# Patient Record
Sex: Male | Born: 1955 | Race: Black or African American | Hispanic: No | Marital: Married | Smoking: Former smoker
Health system: Southern US, Community
[De-identification: ages and names within clinical notes are randomized; demographics above are authoritative.]

## PROBLEM LIST (undated history)

## (undated) DIAGNOSIS — J302 Other seasonal allergic rhinitis: Secondary | ICD-10-CM

## (undated) DIAGNOSIS — M1990 Unspecified osteoarthritis, unspecified site: Secondary | ICD-10-CM

## (undated) DIAGNOSIS — Z973 Presence of spectacles and contact lenses: Secondary | ICD-10-CM

## (undated) DIAGNOSIS — G629 Polyneuropathy, unspecified: Secondary | ICD-10-CM

## (undated) DIAGNOSIS — E119 Type 2 diabetes mellitus without complications: Secondary | ICD-10-CM

---

## 1998-08-23 HISTORY — PX: FINGER AMPUTATION: SHX636

## 2013-09-26 ENCOUNTER — Encounter: Payer: Self-pay | Admitting: Diagnostic Neuroimaging

## 2013-09-26 ENCOUNTER — Encounter (INDEPENDENT_AMBULATORY_CARE_PROVIDER_SITE_OTHER): Payer: Self-pay

## 2013-09-26 ENCOUNTER — Ambulatory Visit (INDEPENDENT_AMBULATORY_CARE_PROVIDER_SITE_OTHER): Payer: BC Managed Care – PPO | Admitting: Diagnostic Neuroimaging

## 2013-09-26 VITALS — BP 141/81 | HR 95 | Ht 66.5 in | Wt 171.0 lb

## 2013-09-26 DIAGNOSIS — M79609 Pain in unspecified limb: Secondary | ICD-10-CM

## 2013-09-26 DIAGNOSIS — M545 Low back pain, unspecified: Secondary | ICD-10-CM

## 2013-09-26 DIAGNOSIS — E1149 Type 2 diabetes mellitus with other diabetic neurological complication: Secondary | ICD-10-CM

## 2013-09-26 DIAGNOSIS — R209 Unspecified disturbances of skin sensation: Secondary | ICD-10-CM

## 2013-09-26 DIAGNOSIS — E1142 Type 2 diabetes mellitus with diabetic polyneuropathy: Secondary | ICD-10-CM

## 2013-09-26 DIAGNOSIS — R200 Anesthesia of skin: Secondary | ICD-10-CM

## 2013-09-26 DIAGNOSIS — E1140 Type 2 diabetes mellitus with diabetic neuropathy, unspecified: Secondary | ICD-10-CM

## 2013-09-26 DIAGNOSIS — M542 Cervicalgia: Secondary | ICD-10-CM

## 2013-09-26 MED ORDER — GABAPENTIN 300 MG PO CAPS: 600.0000 mg | ORAL_CAPSULE | Freq: Three times a day (TID) | ORAL | Status: DC

## 2013-09-26 NOTE — Patient Instructions (Signed)
I will check labs and lumbar puncture.  Increase gabapentin to 600mg  (3 times per day).

## 2013-09-26 NOTE — Progress Notes (Signed)
[facility]  PATIENT: Austin Savage DOB: 08-16-1956  REFERRING CLINICIAN: Nitka HISTORY FROM: patient  REASON FOR VISIT: new consult   HISTORICAL  CHIEF COMPLAINT:  Chief Complaint  Patient presents with  . Neurologic Problem    NP..Diabetes, Polyneuropathic pain #7    HISTORY OF PRESENT ILLNESS:   58 year old ambitious male with diabetes, hypercholesterolemia, here for evaluation of neck pain, low back pain, diabetic neuropathy.   2011 patient developed neck pain radiating to his right shoulder, right elbow. He had some left arm pain. 2013 patient had low back pain radiating to both legs.  Symptoms have progressively worsened. He also has numbness and tingling in his toes and feet with some burning sensation. He has developed some weakness in his left arm and left leg.  Patient is seen by orthopedic surgery had MRI of the cervical and lumbar spine with mild degenerative changes. 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    REVIEW OF SYSTEMS: Full 14 system review of systems performed and notable only for numbness, weakness, pain.  ALLERGIES: No Known Allergies  HOME MEDICATIONS: No outpatient prescriptions prior to visit.   No facility-administered medications prior to visit.    PAST MEDICAL HISTORY: No past medical history on file.  PAST SURGICAL HISTORY: No past surgical history on file.  FAMILY HISTORY: No family history on file.  SOCIAL HISTORY:  History   Social History  . Marital Status: Unknown    Spouse Name: katheren    Number of Children: 3  . Years of Education: college   Occupational History  . Not on file.   Social History Main Topics  . Smoking status: Former Games developer  . Smokeless tobacco: Not on file  . Alcohol Use: No  . Drug Use: No  . Sexual Activity: Not on file   Other Topics Concern  . Not on file   Social History Narrative  .  No narrative on file     PHYSICAL EXAM  Filed Vitals:   09/26/13 1037  BP: 141/81  Pulse: 95  Height: 5' 6.5" (1.689 m)  Weight: 171 lb (77.565 kg)    Not recorded    Body mass index is 27.19 kg/(m^2).  GENERAL EXAM: Patient is in no distress; well developed, nourished and groomed; neck is supple; PARTIALLY EDENTULOUS.  CARDIOVASCULAR: Regular rate and rhythm, no murmurs, no carotid bruits  NEUROLOGIC: MENTAL STATUS: awake, alert, oriented to person, place and time, recent and remote memory intact, normal attention and concentration, language fluent, comprehension intact, naming intact, fund of knowledge appropriate CRANIAL NERVE: no papilledema on fundoscopic exam, pupils equal and reactive to light, visual fields full to confrontation, extraocular muscles intact, no nystagmus, facial sensation and strength symmetric, hearing intact, palate elevates symmetrically, uvula midline, shoulder shrug symmetric, tongue midline. MOTOR: normal bulk and tone, full strength in the RUE AND RLE; LUE (TRICEPS 4), LLE (HF 3) SENSORY: ABSENT VIB AT TOES; DECR PP, TEMP IN FEET.  COORDINATION: finger-nose-finger normal REFLEXES: BUE 1, KNEES TRACE, ANKLES 0. DOWN GOING TOES. GAIT/STATION: SLOW, ANTALGIC GAIT. DIFF WITH TOE AND HEEL. CANNOT TANDEM. Romberg is negative    DIAGNOSTIC DATA (LABS, IMAGING, TESTING) - I reviewed patient records, labs, notes, testing and imaging myself where available.  No results found for this basename: WBC, HGB, HCT, MCV, PLT   No results found for this basename: na, k, cl, co2, glucose, bun, creatinine, calcium, prot, albumin, ast, alt, alkphos, bilitot, gfrnonaa, gfraa   No  results found for this basename: CHOL, HDL, LDLCALC, LDLDIRECT, TRIG, CHOLHDL   No results found for this basename: HGBA1C   No results found for this basename: VITAMINB12   No results found for this basename: TSH    08/09/13 MRI cervical spine - mild degenerative chages with mild  biforaminal foraminal stenosis C4 to C7, and mild-mod spinal stenosis C5-6 and C6-7.   08/09/13 MRI lumbar spine - mild biforaminal foraminal stenosis at L4-5 and L5-S1.   ASSESSMENT AND PLAN  58 y.o. year old male here with progressive neck pain, low back pain, numbness and tingling in the feet, left arm and left leg weakness. Mild degenerative changes of cervical and lumbar spine noted on MRI. EMG demonstrates axonal and demyelinating neuropathy. Will proceed with demyelinating neuropathy workup. Diabetes could also account for patient's symptoms.  Ddx: demyelinating neuropathy (CIDP), diabetic neuropathy, metabolic, autoimmune, inflamm, musculoskeletal pain, arthritis/degenerative  PLAN: - increase gabapentin to 600mg  TID - labs and lumbar puncture testing (demyelinating neuropathy workup) - consider IVIG treatment after workup   Orders Placed This Encounter  Procedures  . DG FLUORO GUIDE LUMBAR PUNCTURE  . Neuropathy Panel  . Hemoglobin A1c  . Vitamin B12  . TSH  . CBC With differential/Platelet  . Comprehensive metabolic panel  . ANA w/Reflex  . HIV antibody  . RPR  . Pan-ANCA    Return in about 3 months (around 12/24/2013).    Suanne MarkerVIKRAM R. Ronae Noell, MD 09/26/2013, 12:02 PM Certified in Neurology, Neurophysiology and Neuroimaging  [facility] [address], [address] [place], [place] [address] [phone]

## 2013-09-28 ENCOUNTER — Telehealth: Payer: Self-pay | Admitting: Diagnostic Neuroimaging

## 2013-09-28 ENCOUNTER — Other Ambulatory Visit: Payer: Self-pay | Admitting: Diagnostic Neuroimaging

## 2013-09-28 DIAGNOSIS — R200 Anesthesia of skin: Secondary | ICD-10-CM

## 2013-09-28 NOTE — Telephone Encounter (Signed)
Danielle from [facility] calling to state that we referred him to them to get an LP but he needs to have an order for MR scan of the brain before they can do the LP. Please call Danielle and send order.

## 2013-09-28 NOTE — Telephone Encounter (Signed)
Ordered. -VRP 

## 2013-09-28 NOTE — Telephone Encounter (Signed)
I called Austin Savage, [facility] and although has had MRI C and L spines.  Needs to have MRI brain.  They had spoken to pt and he has not had one.

## 2013-10-23 ENCOUNTER — Encounter (HOSPITAL_BASED_OUTPATIENT_CLINIC_OR_DEPARTMENT_OTHER): Payer: Self-pay | Admitting: *Deleted

## 2013-10-23 ENCOUNTER — Other Ambulatory Visit (HOSPITAL_BASED_OUTPATIENT_CLINIC_OR_DEPARTMENT_OTHER): Payer: Self-pay | Admitting: Specialist

## 2013-10-23 NOTE — Progress Notes (Signed)
Pt has had very little health issues except diabetic-no cardiac-will need ekg-had bmet-cbc 09/26/13-may need istat if anesthesia wants

## 2013-10-29 ENCOUNTER — Encounter (HOSPITAL_BASED_OUTPATIENT_CLINIC_OR_DEPARTMENT_OTHER): Payer: Self-pay | Admitting: Anesthesiology

## 2013-10-29 ENCOUNTER — Encounter (HOSPITAL_BASED_OUTPATIENT_CLINIC_OR_DEPARTMENT_OTHER)
Admission: RE | Disposition: A | Discharge status: Home / Self Care | Payer: Self-pay | Source: Ambulatory Visit | Attending: Specialist

## 2013-10-29 ENCOUNTER — Ambulatory Visit (HOSPITAL_BASED_OUTPATIENT_CLINIC_OR_DEPARTMENT_OTHER)
Admission: RE | Admit: 2013-10-29 | Discharge: 2013-10-29 | Disposition: A | Discharge status: Home / Self Care | Payer: BC Managed Care – PPO | Source: Ambulatory Visit | Attending: Specialist | Admitting: Specialist

## 2013-10-29 ENCOUNTER — Ambulatory Visit (HOSPITAL_BASED_OUTPATIENT_CLINIC_OR_DEPARTMENT_OTHER): Payer: BC Managed Care – PPO | Admitting: Anesthesiology

## 2013-10-29 ENCOUNTER — Encounter (HOSPITAL_BASED_OUTPATIENT_CLINIC_OR_DEPARTMENT_OTHER): Payer: BC Managed Care – PPO | Admitting: Anesthesiology

## 2013-10-29 DIAGNOSIS — G589 Mononeuropathy, unspecified: Secondary | ICD-10-CM | POA: Insufficient documentation

## 2013-10-29 DIAGNOSIS — S68118A Complete traumatic metacarpophalangeal amputation of other finger, initial encounter: Secondary | ICD-10-CM | POA: Insufficient documentation

## 2013-10-29 DIAGNOSIS — G5600 Carpal tunnel syndrome, unspecified upper limb: Secondary | ICD-10-CM | POA: Insufficient documentation

## 2013-10-29 DIAGNOSIS — Z87891 Personal history of nicotine dependence: Secondary | ICD-10-CM | POA: Insufficient documentation

## 2013-10-29 DIAGNOSIS — G5601 Carpal tunnel syndrome, right upper limb: Secondary | ICD-10-CM | POA: Diagnosis present

## 2013-10-29 DIAGNOSIS — E119 Type 2 diabetes mellitus without complications: Secondary | ICD-10-CM | POA: Insufficient documentation

## 2013-10-29 DIAGNOSIS — M129 Arthropathy, unspecified: Secondary | ICD-10-CM | POA: Insufficient documentation

## 2013-10-29 DIAGNOSIS — M654 Radial styloid tenosynovitis [de Quervain]: Secondary | ICD-10-CM | POA: Insufficient documentation

## 2013-10-29 HISTORY — DX: Type 2 diabetes mellitus without complications: E11.9

## 2013-10-29 HISTORY — DX: Presence of spectacles and contact lenses: Z97.3

## 2013-10-29 HISTORY — PX: DORSAL COMPARTMENT RELEASE: SHX5039

## 2013-10-29 HISTORY — PX: CARPAL TUNNEL RELEASE: SHX101

## 2013-10-29 HISTORY — DX: Other seasonal allergic rhinitis: J30.2

## 2013-10-29 HISTORY — DX: Unspecified osteoarthritis, unspecified site: M19.90

## 2013-10-29 HISTORY — DX: Polyneuropathy, unspecified: G62.9

## 2013-10-29 LAB — POCT I-STAT, CHEM 8
BUN: 22 mg/dL (ref 6–23)
CHLORIDE: 104 meq/L (ref 96–112)
Calcium, Ion: 1.32 mmol/L — ABNORMAL HIGH (ref 1.12–1.23)
Creatinine, Ser: 1.3 mg/dL (ref 0.50–1.35)
Glucose, Bld: 331 mg/dL — ABNORMAL HIGH (ref 70–99)
HCT: 40 % (ref 39.0–52.0)
Hemoglobin: 13.6 g/dL (ref 13.0–17.0)
POTASSIUM: 5.1 meq/L (ref 3.7–5.3)
Sodium: 139 mEq/L (ref 137–147)
TCO2: 25 mmol/L (ref 0–100)

## 2013-10-29 LAB — GLUCOSE, CAPILLARY
GLUCOSE-CAPILLARY: 264 mg/dL — AB (ref 70–99)
GLUCOSE-CAPILLARY: 170 mg/dL — AB (ref 70–99)

## 2013-10-29 SURGERY — CARPAL TUNNEL RELEASE
Anesthesia: General | Site: Wrist | Laterality: Right

## 2013-10-29 MED ORDER — OXYCODONE HCL 5 MG/5ML PO SOLN: 5.0000 mg | Freq: Once | ORAL | Status: DC | PRN

## 2013-10-29 MED ORDER — CHLORHEXIDINE GLUCONATE 4 % EX LIQD: 60.0000 mL | Freq: Once | CUTANEOUS | Status: DC

## 2013-10-29 MED ORDER — BUPIVACAINE HCL (PF) 0.5 % IJ SOLN
INTRAMUSCULAR | Status: AC
  Filled 2013-10-29: qty 30

## 2013-10-29 MED ORDER — MIDAZOLAM HCL 2 MG/2ML IJ SOLN: 1.0000 mg | INTRAMUSCULAR | Status: DC | PRN

## 2013-10-29 MED ORDER — CEFAZOLIN SODIUM-DEXTROSE 2-3 GM-% IV SOLR
2.0000 g | INTRAVENOUS | Status: AC
  Administered 2013-10-29: 2 g via INTRAVENOUS

## 2013-10-29 MED ORDER — MIDAZOLAM HCL 2 MG/2ML IJ SOLN
INTRAMUSCULAR | Status: AC
  Filled 2013-10-29: qty 2

## 2013-10-29 MED ORDER — CEFAZOLIN SODIUM-DEXTROSE 2-3 GM-% IV SOLR
INTRAVENOUS | Status: AC
  Filled 2013-10-29: qty 50

## 2013-10-29 MED ORDER — HYDROCODONE-ACETAMINOPHEN 5-325 MG PO TABS: 1.0000 | ORAL_TABLET | ORAL | Status: DC | PRN

## 2013-10-29 MED ORDER — DEXAMETHASONE SODIUM PHOSPHATE 4 MG/ML IJ SOLN
INTRAMUSCULAR | Status: DC | PRN
  Administered 2013-10-29: 10 mg via INTRAVENOUS

## 2013-10-29 MED ORDER — LACTATED RINGERS IV SOLN
INTRAVENOUS | Status: DC
  Administered 2013-10-29: 10:00:00 via INTRAVENOUS

## 2013-10-29 MED ORDER — FENTANYL CITRATE 0.05 MG/ML IJ SOLN: 50.0000 ug | INTRAMUSCULAR | Status: DC | PRN

## 2013-10-29 MED ORDER — INSULIN ASPART 100 UNIT/ML ~~LOC~~ SOLN
10.0000 [IU] | Freq: Once | SUBCUTANEOUS | Status: AC
  Administered 2013-10-29: 10 [IU] via SUBCUTANEOUS

## 2013-10-29 MED ORDER — FENTANYL CITRATE 0.05 MG/ML IJ SOLN
INTRAMUSCULAR | Status: DC | PRN
  Administered 2013-10-29: 100 ug via INTRAVENOUS

## 2013-10-29 MED ORDER — PROMETHAZINE HCL 25 MG/ML IJ SOLN: 6.2500 mg | INTRAMUSCULAR | Status: DC | PRN

## 2013-10-29 MED ORDER — ONDANSETRON HCL 4 MG/2ML IJ SOLN
INTRAMUSCULAR | Status: DC | PRN
  Administered 2013-10-29: 4 mg via INTRAVENOUS

## 2013-10-29 MED ORDER — BUPIVACAINE HCL (PF) 0.5 % IJ SOLN
INTRAMUSCULAR | Status: DC | PRN
  Administered 2013-10-29: 10 mL

## 2013-10-29 MED ORDER — PROPOFOL 10 MG/ML IV BOLUS
INTRAVENOUS | Status: DC | PRN
  Administered 2013-10-29: 200 mg via INTRAVENOUS

## 2013-10-29 MED ORDER — FENTANYL CITRATE 0.05 MG/ML IJ SOLN
INTRAMUSCULAR | Status: AC
  Filled 2013-10-29: qty 2

## 2013-10-29 MED ORDER — OXYCODONE HCL 5 MG PO TABS: 5.0000 mg | ORAL_TABLET | Freq: Once | ORAL | Status: DC | PRN

## 2013-10-29 MED ORDER — MIDAZOLAM HCL 5 MG/5ML IJ SOLN
INTRAMUSCULAR | Status: DC | PRN
  Administered 2013-10-29: 2 mg via INTRAVENOUS

## 2013-10-29 MED ORDER — LIDOCAINE HCL (CARDIAC) 20 MG/ML IV SOLN
INTRAVENOUS | Status: DC | PRN
  Administered 2013-10-29: 60 mg via INTRAVENOUS

## 2013-10-29 MED ORDER — KETOROLAC TROMETHAMINE 30 MG/ML IJ SOLN
INTRAMUSCULAR | Status: DC | PRN
Start: 2013-10-29 — End: 2013-10-29
  Administered 2013-10-29: 30 mg via INTRAVENOUS

## 2013-10-29 MED ORDER — FENTANYL CITRATE 0.05 MG/ML IJ SOLN
INTRAMUSCULAR | Status: AC
  Filled 2013-10-29: qty 4

## 2013-10-29 SURGICAL SUPPLY — 48 items
BANDAGE ELASTIC 3 VELCRO ST LF (GAUZE/BANDAGES/DRESSINGS) ×3 IMPLANT
BENZOIN TINCTURE PRP APPL 2/3 (GAUZE/BANDAGES/DRESSINGS) IMPLANT
BLADE SURG 15 STRL LF DISP TIS (BLADE) ×1 IMPLANT
BLADE SURG 15 STRL SS (BLADE) ×2
BNDG ESMARK 4X9 LF (GAUZE/BANDAGES/DRESSINGS) ×3 IMPLANT
CLOSURE WOUND 1/2 X4 (GAUZE/BANDAGES/DRESSINGS)
CORDS BIPOLAR (ELECTRODE) ×3 IMPLANT
COVER MAYO STAND STRL (DRAPES) ×6 IMPLANT
COVER TABLE BACK 60X90 (DRAPES) ×3 IMPLANT
CUFF TOURNIQUET SINGLE 18IN (TOURNIQUET CUFF) ×3 IMPLANT
DERMABOND ADVANCED (GAUZE/BANDAGES/DRESSINGS)
DERMABOND ADVANCED .7 DNX12 (GAUZE/BANDAGES/DRESSINGS) IMPLANT
DRAPE EXTREMITY T 121X128X90 (DRAPE) ×3 IMPLANT
DRAPE SURG 17X23 STRL (DRAPES) ×3 IMPLANT
DRSG EMULSION OIL 3X3 NADH (GAUZE/BANDAGES/DRESSINGS) ×3 IMPLANT
DURAPREP 26ML APPLICATOR (WOUND CARE) ×3 IMPLANT
ELECT REM PT RETURN 9FT ADLT (ELECTROSURGICAL) ×3
ELECTRODE REM PT RTRN 9FT ADLT (ELECTROSURGICAL) ×1 IMPLANT
GLOVE BIO SURGEON STRL SZ7.5 (GLOVE) IMPLANT
GLOVE BIOGEL M STRL SZ7.5 (GLOVE) ×3 IMPLANT
GLOVE BIOGEL PI IND STRL 8 (GLOVE) ×2 IMPLANT
GLOVE BIOGEL PI INDICATOR 8 (GLOVE) ×4
GLOVE ECLIPSE 7.0 STRL STRAW (GLOVE) ×3 IMPLANT
GLOVE ECLIPSE 8.5 STRL (GLOVE) ×3 IMPLANT
GLOVE INDICATOR STER SZ 9 (GLOVE) ×3 IMPLANT
GOWN STRL REUS W/ TWL LRG LVL3 (GOWN DISPOSABLE) ×2 IMPLANT
GOWN STRL REUS W/ TWL XL LVL3 (GOWN DISPOSABLE) ×2 IMPLANT
GOWN STRL REUS W/TWL LRG LVL3 (GOWN DISPOSABLE) ×4
GOWN STRL REUS W/TWL XL LVL3 (GOWN DISPOSABLE) ×4
NEEDLE HYPO 22GX1.5 SAFETY (NEEDLE) ×3 IMPLANT
PACK BASIN DAY SURGERY FS (CUSTOM PROCEDURE TRAY) ×3 IMPLANT
PAD CAST 3X4 CTTN HI CHSV (CAST SUPPLIES) ×1 IMPLANT
PADDING CAST ABS 4INX4YD NS (CAST SUPPLIES) ×2
PADDING CAST ABS COTTON 4X4 ST (CAST SUPPLIES) ×1 IMPLANT
PADDING CAST COTTON 3X4 STRL (CAST SUPPLIES) ×2
PENCIL BUTTON HOLSTER BLD 10FT (ELECTRODE) IMPLANT
RUBBERBAND STERILE (MISCELLANEOUS) IMPLANT
SPLINT PLASTER CAST XFAST 3X15 (CAST SUPPLIES) ×5 IMPLANT
SPLINT PLASTER XTRA FASTSET 3X (CAST SUPPLIES) ×10
SPONGE GAUZE 4X4 12PLY (GAUZE/BANDAGES/DRESSINGS) ×3 IMPLANT
SPONGE GAUZE 4X4 12PLY STER LF (GAUZE/BANDAGES/DRESSINGS) IMPLANT
STOCKINETTE 4X48 STRL (DRAPES) ×3 IMPLANT
STRIP CLOSURE SKIN 1/2X4 (GAUZE/BANDAGES/DRESSINGS) IMPLANT
SUT ETHILON 4 0 PS 2 18 (SUTURE) ×3 IMPLANT
SUT VIC AB 3-0 FS2 27 (SUTURE) IMPLANT
SYR BULB 3OZ (MISCELLANEOUS) ×3 IMPLANT
SYR CONTROL 10ML LL (SYRINGE) IMPLANT
TOWEL OR 17X24 6PK STRL BLUE (TOWEL DISPOSABLE) ×3 IMPLANT

## 2013-10-29 NOTE — Brief Op Note (Addendum)
10/29/2013  11:36 AM  PATIENT:  Austin Savage  58 y.o. male  PRE-OPERATIVE DIAGNOSIS:  Right Dequervains tenosynovitis, right carpal tunnel syndrome  POST-OPERATIVE DIAGNOSIS:  Right Dequervains tenosynovitis, right carpal tunnel syndrome  PROCEDURE:  Procedure(s): RIGHT OPEN CARPAL TUNNEL RELEASE (Right) RIGHT DORSAL COMPARTMENT RELEASE (DEQUERVAIN) (Right)  SURGEON:  Surgeon(s) and Role:    Kerrin ChampagneJames E Nitka, MD - Primary  PHYSICIAN ASSISTANT: Maud DeedSheila Vernon, PA-C  ANESTHESIA:   local and general, Dr. Gypsy BalsamKasik.   EBL:  Total I/O In: 1400 [I.V.:1400] Out: -   BLOOD ADMINISTERED:none  DRAINS: none   LOCAL MEDICATIONS USED:  MARCAINE 1/2%   and Amount: 10 ml  SPECIMEN:  No Specimen  DISPOSITION OF SPECIMEN:  N/A  COUNTS:  YES  TOURNIQUET:   Total Tourniquet Time Documented: Upper Arm (laterality) - 19 minutes Total: Upper Arm (laterality) - 19 minutes   DICTATION: .Reubin Milanragon Dictation  PLAN OF CARE: Discharge to home after PACU  PATIENT DISPOSITION:  PACU - hemodynamically stable.   Delay start of Pharmacological VTE agent (>24hrs) due to surgical blood loss or risk of bleeding: not indicated.

## 2013-10-29 NOTE — Transfer of Care (Signed)
Immediate Anesthesia Transfer of Care Note  Patient: Austin Savage  Procedure(s) Performed: Procedure(s): RIGHT OPEN CARPAL TUNNEL RELEASE (Right) RIGHT DORSAL COMPARTMENT RELEASE (DEQUERVAIN) (Right)  Patient Location: PACU  Anesthesia Type:General  Level of Consciousness: awake and sedated  Airway & Oxygen Therapy: Patient Spontanous Breathing and Patient connected to face mask oxygen  Post-op Assessment: Report given to PACU RN and Post -op Vital signs reviewed and stable  Post vital signs: Reviewed and stable  Complications: No apparent anesthesia complications

## 2013-10-29 NOTE — Anesthesia Procedure Notes (Signed)
Procedure Name: LMA Insertion Performed by: Malik Ruffino W Pre-anesthesia Checklist: Patient identified, Timeout performed, Emergency Drugs available, Suction available and Patient being monitored Patient Re-evaluated:Patient Re-evaluated prior to inductionOxygen Delivery Method: Circle system utilized Preoxygenation: Pre-oxygenation with 100% oxygen Intubation Type: IV induction Ventilation: Mask ventilation without difficulty LMA: LMA inserted LMA Size: 4.0 Tube type: Oral Number of attempts: 1 Placement Confirmation: positive ETCO2 and breath sounds checked- equal and bilateral Tube secured with: Tape Dental Injury: Teeth and Oropharynx as per pre-operative assessment      

## 2013-10-29 NOTE — Op Note (Signed)
10/29/2013  11:39 AM  PATIENT:  Austin Savage  58 y.o. male  MRN: [id number]  PRE-OPERATIVE DIAGNOSIS:  Right Dequervains tenosynovitis, right carpal tunnel syndrome  POST-OPERATIVE DIAGNOSIS:  Right Dequervains tenosynovitis, right carpal tunnel syndrome  PROCEDURE:  Procedure(s): RIGHT OPEN CARPAL TUNNEL RELEASE RIGHT DORSAL COMPARTMENT RELEASE (DEQUERVAIN)   OPERATIVE REPORT     SURGEON:  Bernice E. Nitka, MD      ASSISTANT:  Sheila Vernon, PA-C  (Present throughout the entire procedure and necessary for completion of procedure in a timely manner)      ANESTHESIA:  General anesthesia, Supplemented with local Marcaine 0.5 % 10cc Dr. Kasik     COMPLICATIONS:  None.      TOURNIQUET TIME: 20 minutes at  250mmHg   PROCEDURE: The patient was met in the holding area, and the appropriate right wrist identified and marked.  The patient was then transported to OR and was placed on the operative table in a supine position. The patient was then placed undergeneral anesthesia without difficulty. The patient received appropriate preoperative antibiotic prophylaxis 2 g Ancef.     The right upper extremity was then prepped using sterile conditions and draped using sterile technique.  Time-out procedure was called and correct  .Using loope magnification and head lamp a 1.5 inch incision curved at the wrist crease with 15 blade scalpel.  Incision through skin and subcutaneous tissue to the volar forearm fascia and  transverse carpal ligament. Fascia then carefully lifed and incised with Stevens scissors inline with the fourth digit. The skin and subcutaneous tissue retracted and the volar fascia divided under direct vision from distal to proximal. A freer elevator then carefully placed between the median nerve and the transverse carpal ligament protecting the  median nerve as the transverse carpal ligament was divided with a 15 blade scalpel in line with the fourth digit. Retracting the distal skin  and subcutaneous tissues distally under direct visualization the remaining portions of the transverse carpal ligament were divided with tenotomy scissors again in line with the fourth digit. The palmar fascia was then divided until the traversing superficial palmar arch was identified and preserved intact.  The motor branch of the median nerve was carefully examined and identified intact.  Attention then turned to the right first dorsal compartment. An incision made along the volar aspect of Listers tubercle longitudinally through the skin. This was done after infiltration of the skin using Marcaine half percent plain. Total of 4 cc injected here. Knee and superficial distal branches of the radial nerve identified and carefully mobilized. These were then retracted dorsally using smooth retractors. Incision then made along the dorsal aspect of the first dorsal compartment using Stephen scissors then 15 blade scalpel. Incision carried of her a length of about 2 and half centimeters. Hypertrophic changes in the sheath were identified multiple tendon slips were also identified and freed within the first dorsal compartment. Synovitis changes were noted and these were debrided using Stephen scissors from distal to proximal. Tendons were then noted to be gliding without signs of further compression there was no signs of subluxation with dorsiflexion and volar flexion of the wrist and flexion of the thumb. Irrigation was carried out over both the first dorsal compartment incision site as well as a carpal tunnel incision site. Tourniquet was then released. Bleeding controlled with bipolar electrocautery. The incisions were then irrigated with copious amounts of irrigant solution, No active bleeding was present. The incisions closed with a single layer skin closure of 4-0 nylon   horizontal mattress sutures. Dry dressing of adaptic, 4x4s held in place with sterile webril.  A well padded volar splint applied with ace  wrap.  The patient reactivated and returned to the PACU in good condition.  All instruments and sponge counts were correct.          NITKA,Roan E  10/29/2013, 11:39 AM  

## 2013-10-29 NOTE — Anesthesia Postprocedure Evaluation (Signed)
  Anesthesia Post-op Note  Patient: Austin Savage  Procedure(s) Performed: Procedure(s): RIGHT OPEN CARPAL TUNNEL RELEASE (Right) RIGHT DORSAL COMPARTMENT RELEASE (DEQUERVAIN) (Right)  Patient Location: PACU  Anesthesia Type:General   Level of Consciousness: awake, alert  and oriented  Airway and Oxygen Therapy: Patient Spontanous Breathing  Post-op Pain: mild  Post-op Assessment: Post-op Vital signs reviewed  Post-op Vital Signs: Reviewed  Complications: No apparent anesthesia complications

## 2013-10-29 NOTE — Discharge Instructions (Addendum)
Keep dressing dry. Elevated wrist above heart. Apply ice to palm side of wrist two hours on and one half hour off for 48 hours. May Apply ice at night and go to sleep with out changing. Be sure to keep ice off fingers to prevent frost bite.  Return to office in two days for removal of drain left wrist.  Post Anesthesia Home Care Instructions  Activity: Get plenty of rest for the remainder of the day. A responsible adult should stay with you for 24 hours following the procedure.  For the next 24 hours, DO NOT: -Drive a car -Advertising copywriterperate machinery -Drink alcoholic beverages -Take any medication unless instructed by your physician -Make any legal decisions or sign important papers.  Meals: Start with liquid foods such as gelatin or soup. Progress to regular foods as tolerated. Avoid greasy, spicy, heavy foods. If nausea and/or vomiting occur, drink only clear liquids until the nausea and/or vomiting subsides. Call your physician if vomiting continues.  Special Instructions/Symptoms: Your throat may feel dry or sore from the anesthesia or the breathing tube placed in your throat during surgery. If this causes discomfort, gargle with warm salt water. The discomfort should disappear within 24 hours.   Keep dressing dry and clean. Move fingers frequently.  No pushing, pulling or lifting with right hand. May use ice packs as wrist. Elevate at rest.

## 2013-10-29 NOTE — H&P (Signed)
Austin Savage is an 58 y.o. male.   Chief Complaint:  HPI: he is having complaints of discomfort along the radial aspect of the right hand and he is tender over the right thumb abductor extensor tendon first compartment along the radial aspect of the Lister tubercle.  He has a positive Finkelstein test directly tender in this area, minimal swelling evident.  His findings are consistent with a right de Quervain disease as well as carpal tunnel syndrome.  Findings discussed with Austin Savage and indicated to him that he should consider undergoing a carpal tunnel release as this is his right dominant wrist and hand and the findings are not mild or slight, they are moderate-to-severe.  He says he is experiencing increasing numbness, increasing tendency to numbness not to go away which is worrisome for prolonged nerve compression associated with rather severe findings.He does have a problem with tendinitis involving the first dorsal compartment of the right wrist de Quervain sy  He has already undergone splinting as with his carpal tunnel.  Anti-inflammatory agents and steroids have been used and his pain persists not just in his area of his carpal tunnel over the volar aspect of the right wrist, but also in the area of right first dorsal compartment where he has de Quervain disease.   EMGs/nerve conduction studies demonstrate moderate-to-severe right carpal tunnel findings with delay conduction across the wrist, findings in both the sensory and motor portion of the nerve.      Recommend a open carpal tunnel release which is a relatively short procedure done with a regional anesthetic, but since he is having discomfort in the area of the first dorsal compartment, a first dorsal compartment release would also be undertaken at the same sitting in order to relieve pain associated with both the wrist carpal tunnel as well as de Quervain process.  Discussed the risks and benefits of undergoing procedure, quoted 1 and 100 risk of  infection due to the fact that he does have diabetes.  I see his situation is one where he needs to use his wrist for repetitive motion in his current job function. He is not likely to see a great deal of improvement as attempts at splinting have not been successful.    Past Medical History  Diagnosis Date  . Wears glasses     reading  . Diabetes mellitus without complication   . Seasonal allergies   . Neuropathy   . Arthritis     Past Surgical History  Procedure Laterality Date  . 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 No pertinent family history. Social History:  reports that he quit smoking about 31 years ago. He does not have any smokeless tobacco history on file. He reports that he does not drink alcohol or use illicit drugs.  Allergies: No Known Allergies  Medications Prior to Admission  Medication Sig Dispense Refill  . cetirizine (ZYRTEC) 10 MG tablet Take 10 mg by mouth daily.      . ferrous sulfate 325 (65 FE) MG tablet Take 325 mg by mouth 2 (two) times daily with a meal.      . gabapentin (NEURONTIN) 300 MG capsule Take 2 capsules (600 mg total) by mouth 3 (three) times daily.  180 capsule  12  . glimepiride (AMARYL) 4 MG tablet Take 4 mg by mouth daily.      . metFORMIN (GLUCOPHAGE) 500 MG tablet Take 500 mg by mouth daily.      Marland Kitchen  simvastatin (ZOCOR) 20 MG tablet Take 20 mg by mouth daily.        No results found for this or any previous visit (from the past 48 hour(s)). No results found.  Review of Systems  Musculoskeletal: Positive for back pain and neck pain.  Neurological: Positive for tingling and focal weakness.       Right hand  Psychiatric/Behavioral: Positive for suicidal ideas.    Blood pressure 133/79, pulse 85, temperature 98 F (36.7 C), temperature source Oral, resp. rate 20, height 5' 6.5" (1.689 Savage), weight 75.297 kg (166 lb), SpO2 100.00%. Physical Exam  Constitutional: He is oriented to person, place,  and time. He appears well-developed and well-nourished.  HENT:  Head: Normocephalic.  Respiratory: Effort normal.  GI: Soft.  Musculoskeletal:  positive for Tinel sign at the right wrist.  This is just at the distal area of the expected transverse carpal ligament.  Positive Phalen sign at about 10-15 seconds.  Numbness along the radial 3-1/2 digits.   he is tender over the right thumb abductor extensor tendon first compartment along the radial aspect of the Lister tubercle.  He has a positive Finkelstein test directly tender in this area, minimal swelling evident consistent with a right de Quervain disease as well as carpal tunnel syndrome  Neurological: He is alert and oriented to person, place, and time.  Skin: Skin is warm and dry.     Assessment/Plan: Right carpal tunnel syndrome Right de Quervain tendonitis  PLAN:  Right carpal tunnel release and right first dorsal compartment release  Austin Savage 10/29/2013, 9:18 AM

## 2013-10-29 NOTE — Anesthesia Preprocedure Evaluation (Signed)
Anesthesia Evaluation  Patient identified by MRN, date of birth, ID band Patient awake    Reviewed: Allergy & Precautions, H&P , NPO status , Patient's Chart, lab work & pertinent test results  Airway Mallampati: II TM Distance: >3 FB Neck ROM: Full    Dental   Pulmonary former smoker,  breath sounds clear to auscultation        Cardiovascular Rhythm:Regular Rate:Normal     Neuro/Psych    GI/Hepatic   Endo/Other  diabetes, Oral Hypoglycemic Agents  Renal/GU      Musculoskeletal   Abdominal   Peds  Hematology   Anesthesia Other Findings   Reproductive/Obstetrics                           Anesthesia Physical Anesthesia Plan  ASA: III  Anesthesia Plan: General   Post-op Pain Management:    Induction: Intravenous  Airway Management Planned: LMA  Additional Equipment:   Intra-op Plan:   Post-operative Plan: Extubation in OR  Informed Consent: I have reviewed the patients History and Physical, chart, labs and discussed the procedure including the risks, benefits and alternatives for the proposed anesthesia with the patient or authorized representative who has indicated his/her understanding and acceptance.     Plan Discussed with: CRNA and Surgeon  Anesthesia Plan Comments:         Anesthesia Quick Evaluation

## 2013-10-29 NOTE — Interval H&P Note (Signed)
History and Physical Interval Note:  10/29/2013 10:44 AM  Austin Savage  has presented today for surgery, with the diagnosis of Right Dequervains tenosynovitis, right carpal tunnel syndrome  The various methods of treatment have been discussed with the patient and family. After consideration of risks, benefits and other options for treatment, the patient has consented to  Procedure(s): RIGHT OPEN CARPAL TUNNEL RELEASE (Right) RIGHT DORSAL COMPARTMENT RELEASE (DEQUERVAIN) (Right) as a surgical intervention .  The patient's history has been reviewed, patient examined, no change in status, stable for surgery.  I have reviewed the patient's chart and labs.  Questions were answered to the patient's satisfaction.     Austin Savage,Austin Savage

## 2013-10-31 ENCOUNTER — Encounter (HOSPITAL_BASED_OUTPATIENT_CLINIC_OR_DEPARTMENT_OTHER): Payer: Self-pay | Admitting: Specialist

## 2013-12-28 ENCOUNTER — Ambulatory Visit: Payer: BC Managed Care – PPO | Admitting: Diagnostic Neuroimaging

## 2014-01-08 ENCOUNTER — Ambulatory Visit: Payer: BC Managed Care – PPO | Admitting: Diagnostic Neuroimaging

## 2014-01-09 ENCOUNTER — Ambulatory Visit: Payer: BC Managed Care – PPO | Admitting: Diagnostic Neuroimaging

## 2014-04-11 ENCOUNTER — Ambulatory Visit: Payer: BC Managed Care – PPO | Admitting: Diagnostic Neuroimaging

## 2016-06-30 ENCOUNTER — Telehealth (INDEPENDENT_AMBULATORY_CARE_PROVIDER_SITE_OTHER): Payer: Self-pay | Admitting: Radiology

## 2016-06-30 NOTE — Telephone Encounter (Signed)
Patient states that he fell in the garage, he wants to speak Herbert SetaHeather about what he needs to do.

## 2016-06-30 NOTE — Telephone Encounter (Signed)
Called patient and advised would need to be seen in office to be evaluated from the fall. Scheduled appt with Dr. Berline Choughigby since Dr. Otelia SergeantNitka was scheduling out into December. Thanks.

## 2016-07-02 ENCOUNTER — Ambulatory Visit (INDEPENDENT_AMBULATORY_CARE_PROVIDER_SITE_OTHER): Payer: Medicare HMO

## 2016-07-02 ENCOUNTER — Ambulatory Visit (INDEPENDENT_AMBULATORY_CARE_PROVIDER_SITE_OTHER): Payer: Medicare HMO | Admitting: Sports Medicine

## 2016-07-02 ENCOUNTER — Encounter (INDEPENDENT_AMBULATORY_CARE_PROVIDER_SITE_OTHER): Payer: Self-pay | Admitting: Sports Medicine

## 2016-07-02 VITALS — BP 142/80 | HR 92 | Ht 66.5 in | Wt 172.0 lb

## 2016-07-02 DIAGNOSIS — M545 Low back pain, unspecified: Secondary | ICD-10-CM

## 2016-07-02 DIAGNOSIS — M542 Cervicalgia: Secondary | ICD-10-CM | POA: Diagnosis not present

## 2016-07-02 NOTE — Progress Notes (Signed)
Austin Savage - 60 y.o. male MRN [id number]  Date of birth: 13-Dec-1955  Office Visit Note: Visit Date: 07/02/2016 PCP: [name],[name], MD Referred by: [name], [name], *  Subjective: Chief Complaint  Patient presents with  . Neck - Pain  . Middle Back - Pain  . Lower Back - Pain   HPI: Patient states he fell in the shower this past weekend and hurt his neck and back.  States his neck feels stiff at times.  Hurts when moving neck up/down and little when he turns.  Back hurts when sitting most of the time, feels tight. Pain is not radiating.  Pain is mainly located at the cervicothoracic junction as well as the mid lumbar region. He does not have any radicular symptoms. Reports losing his footing while in the shower with no overt giving way. He is not having any changes in bowel or bladder.    ROS Otherwise per HPI.  Assessment & Plan: Visit Diagnoses:  1. Neck pain   2. Acute bilateral low back pain without sciatica     Plan: Findings:  His overall doing quite well. I like to see how he does with continued conservative measures with slow return to normal activities. If any persistent lack of improvement he will plan follow-up with Dr. Otelia SergeantNitka for further evaluation but overall I suspect slow resolution of symptoms over the next 2-3 weeks.    Meds & Orders: No orders of the defined types were placed in this encounter.   Orders Placed This Encounter  Procedures  . XR Cervical Spine 2 or 3 views  . XR Lumbar Spine 2-3 Views    Follow-up: Return if symptoms worsen or fail to improve.   Procedures: No procedures performed  No notes on file   Clinical History: No specialty comments available.  He reports that he quit smoking about 33 years ago. He does not have any smokeless tobacco history on file. No results for input(s): HGBA1C, LABURIC in the last 8760 hours.  Objective:  VS:  HT:5' 6.5" (168.9 cm)   WT:172 lb (78 kg)  BMI:27.4    BP:(!) 142/80  HR:92bpm   TEMP: ( )  RESP:  Physical Exam  Constitutional:  Adult male in no acute distress alert & appropriately his vital signs are reviewed. His bilateral lower shin is overall well aligned. His upper extremities to have a slight flexure contracture of the fourth & fifth digits on the right. Grip strength is slightly diminished over in this region but reports as being chronic. He has good wrist flexion extension as well as elbow flexion & extension. Upper extremity sensation is intact to light touch. He is markedly limited cervical range of motion with only 30 of side bending from each direction as well as 35-40 of cervical rotation. He has no pain with Spurling's compression test. He has bilateral paraspinal muscle tenderness within the bilateral rhomboids. Minimal tender palpation along the midline of the upper cervical & thoracic regions. Upper extremity radial pulses are 2+/4. Bilateral lower series are negative straight leg raises with good internal/external rotation of bilateral hips. Minimal pain with midline palpation of lumbar spine but some paraspinal muscle tenderness.    Ortho Exam Imaging: Xr Cervical Spine 2 Or 3 Views  Result Date: 07/02/2016 Multilevel degenerative changes with straightening of the cervical Lord doses. No acute fracture dislocation noted. Stable compared to prior x-rays with C2/C3 loss of disc space as well as lower cervical disc space loss.  Xr Lumbar Spine  2-3 Views  Result Date: 07/02/2016 Multilevel degenerative changes that are mild in nature. Stable compared to prior x-rays. No acute fracture dislocation. Overall well aligned.   Past Medical/Family/Surgical/Social History: Medications & Allergies reviewed per EMR Patient Active Problem List   Diagnosis Date Noted  . Carpal tunnel syndrome of right wrist 10/29/2013    Class: Chronic  . De Quervain's tenosynovitis, right 10/29/2013    Class: Chronic   Past Medical History:  Diagnosis Date  . Arthritis     . Diabetes mellitus without complication (HCC)   . Neuropathy (HCC)   . Seasonal allergies   . Wears glasses    reading   No family history on file. Past Surgical History:  Procedure Laterality Date  . CARPAL TUNNEL RELEASE Right 10/29/2013   Procedure: RIGHT OPEN CARPAL TUNNEL RELEASE;  Surgeon: Kerrin ChampagneJames E Nitka, MD;  Location: [facility];  Service: Orthopedics;  Laterality: Right;  . DORSAL COMPARTMENT RELEASE Right 10/29/2013   Procedure: RIGHT DORSAL COMPARTMENT RELEASE (DEQUERVAIN);  Surgeon: Kerrin ChampagneJames E Nitka, MD;  Location: [facility];  Service: Orthopedics;  Laterality: Right;  . 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 Not on file.   Social History Main Topics  . Smoking status: Former Smoker    Quit date: 10/24/1982  . Smokeless tobacco: Not on file  . Alcohol use No  . Drug use: No  . Sexual activity: Not on file

## 2016-12-22 ENCOUNTER — Ambulatory Visit (INDEPENDENT_AMBULATORY_CARE_PROVIDER_SITE_OTHER): Payer: Medicare HMO | Admitting: Specialist

## 2016-12-22 ENCOUNTER — Encounter (INDEPENDENT_AMBULATORY_CARE_PROVIDER_SITE_OTHER): Payer: Self-pay | Admitting: Specialist

## 2016-12-22 VITALS — BP 139/78 | HR 88 | Ht 68.0 in | Wt 174.0 lb

## 2016-12-22 DIAGNOSIS — M47816 Spondylosis without myelopathy or radiculopathy, lumbar region: Secondary | ICD-10-CM | POA: Diagnosis not present

## 2016-12-22 DIAGNOSIS — M4722 Other spondylosis with radiculopathy, cervical region: Secondary | ICD-10-CM | POA: Diagnosis not present

## 2016-12-22 NOTE — Progress Notes (Addendum)
Office Visit Note   Patient: Austin Savage           Date of Birth: 1956-05-23           MRN: [id number] Visit Date: 12/22/2016              Requested by: Arlina Robes, MD [address] [place], [place] [address] PCP: Arlina Robes, MD   Assessment & Plan: Visit Diagnoses:  1. Spondylosis without myelopathy or radiculopathy, lumbar region   2. Other spondylosis with radiculopathy, cervical region   61 year old male with spondylosis of the cervical and lumbar areas and diabetic changes of retinopathy and increasing stiffness in his hands. His studies show mild to moderate cervical stenosis and lumbar spinal stenosiis and he is a borderline candidate for any surgical intervention. Surgeries on the right hand involving the right carpal tunnel and dequervain's release did result in some persistent right hand stiffness especially in the right ring finger MCP and PIP joints. He is stable clinically and I believe he is unable to work full employment gainfully. He  May be capable of some part time work to supplement his income from disabilty and I recommend he consider this to help with feelings of worth and keeping busy. Will follow up appointment in one year.   Plan: Avoid bending, stooping and avoid lifting weights greater than 10 lbs. Avoid prolong standing and walking. Avoid frequent bending and stooping  No lifting greater than 10 lbs. May use ice or moist heat for pain. Weight loss is of benefit. Handicap license is approved. Avoid overhead lifting and overhead use of the arms.  Adjust head rest in vehicle to prevent hyperextension if rear ended. Take extra precautions to avoid falling, including use of a cane if you feel weak.  Follow-Up Instructions: Return in about 1 year (around 12/22/2017).   Orders:  No orders of the defined types were placed in this encounter.  No orders of the defined types were placed in this encounter.     Procedures: No procedures  performed   Clinical Data: No additional findings.   Subjective: Chief Complaint  Patient presents with  . Neck - Follow-up  . Lower Back - Follow-up    61 year old male right handed status post right hand open carpal tunnel release and right 1st dorsal compartment release for Dequervain's disease, still complaints of hand stiffness And difficulty with grip right hand greater than left. Neck and shoulder pain is better with being out of work. He is currently on social security, and is gaining some weight. Getting up and out of the car at times with pain the back and difficulty with the legs not wanting to work. Mowing the yard with a riding now which is better tolerated. Has history of insulin dependent diabetes and he avoids NSAIDs, 10 units of novalog at night and in am. At night lantus 64 units.     Review of Systems  Constitutional: Negative.   HENT: Negative.   Eyes: Negative.   Respiratory: Negative.   Cardiovascular: Negative.   Gastrointestinal: Negative.   Endocrine: Negative.   Genitourinary: Negative.   Musculoskeletal: Negative.   Skin: Negative.   Allergic/Immunologic: Negative.   Neurological: Negative.   Hematological: Negative.   Psychiatric/Behavioral: Negative.      Objective: Vital Signs: BP 139/78 (BP Location: Left Arm, Patient Position: Sitting)   Pulse 88   Ht  (1.727 m)   Wt 174 lb (78.9 kg)   BMI 26.46  kg/m   Physical Exam  Constitutional: He is oriented to person, place, and time. He appears well-developed and well-nourished.  HENT:  Head: Normocephalic and atraumatic.  Eyes: EOM are normal. Pupils are equal, round, and reactive to light.  Neck: Normal range of motion. Neck supple.  Pulmonary/Chest: Effort normal and breath sounds normal.  Abdominal: Soft. Bowel sounds are normal.  Neurological: He is alert and oriented to person, place, and time.  Skin: Skin is warm and dry.  Psychiatric: He has a normal mood and affect. His  behavior is normal. Judgment and thought content normal.    Back Exam   Tenderness  The patient is experiencing tenderness in the cervical and lumbar.  Range of Motion  Extension:  60 abnormal  Flexion:  70 abnormal  Lateral Bend Right: abnormal  Lateral Bend Left: abnormal  Rotation Right: abnormal  Rotation Left: abnormal   Muscle Strength  Right Quadriceps:  5/5  Left Quadriceps:  5/5  Right Hamstrings:  5/5  Left Hamstrings:  5/5   Tests  Straight leg raise right: negative Straight leg raise left: negative  Reflexes  Patellar: normal Achilles: normal Biceps: normal Babinski's sign: normal   Other  Toe Walk: normal Heel Walk: normal Sensation: normal Gait: normal  Erythema: no back redness Scars: absent      Specialty Comments:  No specialty comments available.  Imaging: No results found.   PMFS History: Patient Active Problem List   Diagnosis Date Noted  . Carpal tunnel syndrome of right wrist 10/29/2013    Priority: High    Class: Chronic  . De Quervain's tenosynovitis, right 10/29/2013    Priority: High    Class: Chronic   Past Medical History:  Diagnosis Date  . Arthritis   . Diabetes mellitus without complication (HCC)   . Neuropathy   . Seasonal allergies   . Wears glasses    reading    No family history on file.  Past Surgical History:  Procedure Laterality Date  . CARPAL TUNNEL RELEASE Right 10/29/2013   Procedure: RIGHT OPEN CARPAL TUNNEL RELEASE;  Surgeon: Kerrin Champagne, MD;  Location: [facility];  Service: Orthopedics;  Laterality: Right;  . DORSAL COMPARTMENT RELEASE Right 10/29/2013   Procedure: RIGHT DORSAL COMPARTMENT RELEASE (DEQUERVAIN);  Surgeon: Kerrin Champagne, MD;  Location:  [facility];  Service: Orthopedics;  Laterality: Right;  . 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 Not on file.   Social History Main Topics  . Smoking  status: Former Smoker    Quit date: 10/24/1982  . Smokeless tobacco: Never Used  . Alcohol use No  . Drug use: No  . Sexual activity: Not on file

## 2016-12-22 NOTE — Patient Instructions (Addendum)
Avoid bending, stooping and avoid lifting weights greater than 10 lbs. Avoid prolong standing and walking. Avoid frequent bending and stooping  No lifting greater than 10 lbs. May use ice or moist heat for pain. Weight loss is of benefit. Handicap license is approved. Avoid overhead lifting and overhead use of the arms.  Adjust head rest in vehicle to prevent hyperextension if rear ended. Take extra precautions to avoid falling, including use of a cane if you feel weak.

## 2017-11-02 ENCOUNTER — Telehealth (INDEPENDENT_AMBULATORY_CARE_PROVIDER_SITE_OTHER): Payer: Self-pay | Admitting: Specialist

## 2017-11-02 NOTE — Telephone Encounter (Signed)
Please add patient to wait list (patient couldn't come tomorrow). # [phone]

## 2017-11-02 NOTE — Telephone Encounter (Signed)
I put pt on cancellation list 

## 2017-11-21 ENCOUNTER — Ambulatory Visit (INDEPENDENT_AMBULATORY_CARE_PROVIDER_SITE_OTHER): Payer: Medicare HMO | Admitting: Specialist

## 2017-11-21 ENCOUNTER — Other Ambulatory Visit (INDEPENDENT_AMBULATORY_CARE_PROVIDER_SITE_OTHER): Payer: Self-pay | Admitting: Specialist

## 2017-11-21 ENCOUNTER — Encounter (HOSPITAL_COMMUNITY): Payer: Self-pay

## 2017-11-21 ENCOUNTER — Ambulatory Visit (INDEPENDENT_AMBULATORY_CARE_PROVIDER_SITE_OTHER)
Admission: RE | Admit: 2017-11-21 | Discharge: 2017-11-21 | Disposition: A | Discharge status: Home / Self Care | Payer: Medicare HMO | Source: Ambulatory Visit | Attending: Specialist | Admitting: Specialist

## 2017-11-21 ENCOUNTER — Encounter (INDEPENDENT_AMBULATORY_CARE_PROVIDER_SITE_OTHER): Payer: Self-pay | Admitting: Specialist

## 2017-11-21 ENCOUNTER — Ambulatory Visit (HOSPITAL_COMMUNITY)
Admission: RE | Admit: 2017-11-21 | Discharge: 2017-11-21 | Disposition: A | Discharge status: Home / Self Care | Payer: Medicare HMO | Source: Ambulatory Visit | Attending: Specialist | Admitting: Specialist

## 2017-11-21 VITALS — BP 153/83 | HR 84 | Temp 98.0°F | Ht 68.0 in | Wt 175.0 lb

## 2017-11-21 DIAGNOSIS — M4807 Spinal stenosis, lumbosacral region: Secondary | ICD-10-CM | POA: Diagnosis not present

## 2017-11-21 DIAGNOSIS — I739 Peripheral vascular disease, unspecified: Secondary | ICD-10-CM

## 2017-11-21 DIAGNOSIS — M48062 Spinal stenosis, lumbar region with neurogenic claudication: Secondary | ICD-10-CM | POA: Diagnosis not present

## 2017-11-21 DIAGNOSIS — M79604 Pain in right leg: Secondary | ICD-10-CM

## 2017-11-21 MED ORDER — GABAPENTIN 300 MG PO CAPS: 600.0000 mg | ORAL_CAPSULE | Freq: Three times a day (TID) | ORAL | 12 refills | Status: DC

## 2017-11-21 NOTE — Patient Instructions (Signed)
Avoid bending, stooping and avoid lifting weights greater than 10 lbs. Avoid prolong standing and walking. Avoid frequent bending and stooping  No lifting greater than 10 lbs. May use ice or moist heat for pain. Weight loss is of benefit. Handicap license is approved.  

## 2017-11-21 NOTE — Progress Notes (Addendum)
Office Visit Note   Patient: Austin Savage           Date of Birth: 1956-03-03           MRN: [id number] Visit Date: 11/21/2017              Requested by: [name], [name], MD [address] [place], [place] [id number] PCP: [name], [name], MD   Assessment & Plan: Visit Diagnoses:  1. Right leg claudication (HCC)   2. Spinal stenosis of lumbar region with neurogenic claudication     Plan: Avoid bending, stooping and avoid lifting weights greater than 10 lbs. Avoid prolong standing and walking. Avoid frequent bending and stooping  No lifting greater than 10 lbs. May use ice or moist heat for pain. Weight loss is of benefit. Handicap license is approved. Acute ABI of the legs ordered due to lack of pulse right leg and history of claudication, ABI by  Primary care not available but history of vascular Evaluation with recommended  Arteriogram suggests that this needs to be assessed,  Follow-Up Instructions: Return in about 3 weeks (around 12/12/2017).   Orders:  No orders of the defined types were placed in this encounter.  No orders of the defined types were placed in this encounter.     Procedures: No procedures performed   Clinical Data: No additional findings.   Subjective: Chief Complaint  Patient presents with  . Lower Back - Follow-up  . Right Leg - Pain    62 year old male with history of cervical spondylosis and lumbar degenerative disc disease. He has tightness with standing and walking and has undergone ultrasound of the leg  By his diabetes doctor. He reports that another MD examined his leg and said he may by clogged arteries. No bowel or bladder difficulty. He has no cramping in the right leg he has aching into the right knee. He has some trouble squatting on the right knee and has to get up quickly. No noctural pain or pain in the right leg at night. No feelings of coolness.   Review of Systems  Constitutional: Negative.   HENT: Negative.     Eyes: Negative.   Respiratory: Negative.   Cardiovascular: Negative.   Gastrointestinal: Negative.   Endocrine: Negative.   Genitourinary: Negative.   Musculoskeletal: Negative.   Skin: Negative.   Allergic/Immunologic: Negative.   Neurological: Negative.   Hematological: Negative.   Psychiatric/Behavioral: Negative.      Objective: Vital Signs: BP (!) 153/83   Pulse 84   Temp 98 F (36.7 C)   Ht 5\' 8"  (1.727 m)   Wt 175 lb (79.4 kg)   BMI 26.61 kg/m   Physical Exam  Constitutional: He is oriented to person, place, and time. He appears well-developed and well-nourished.  HENT:  Head: Normocephalic and atraumatic.  Eyes: Pupils are equal, round, and reactive to light. EOM are normal.  Neck: Normal range of motion. Neck supple.  Pulmonary/Chest: Effort normal and breath sounds normal.  Abdominal: Soft. Bowel sounds are normal.  Musculoskeletal: Normal range of motion.  Neurological: He is alert and oriented to person, place, and time.  Skin: Skin is warm and dry.  Psychiatric: He has a normal mood and affect. His behavior is normal. Judgment and thought content normal.    Back Exam   Tenderness  The patient is experiencing tenderness in the lumbar.  Range of Motion  Extension: normal  Flexion: normal  Lateral bend right: normal  Lateral bend left:  normal  Rotation right: normal  Rotation left: normal   Muscle Strength  Right Quadriceps:  5/5  Left Quadriceps:  5/5  Right Hamstrings:  5/5  Left Hamstrings:  5/5   Tests  Straight leg raise right: negative Straight leg raise left: negative  Reflexes  Patellar: normal Achilles: normal Biceps: normal Babinski's sign: normal   Other  Toe walk: normal Heel walk: normal Gait: normal  Erythema: no back redness Scars: absent  Comments:  Absent pulse right leg      Specialty Comments:  No specialty comments available.  Imaging: No results found.   PMFS History: Patient Active Problem List    Diagnosis Date Noted  . Carpal tunnel syndrome of right wrist 10/29/2013    Priority: High    Class: Chronic  . De Quervain's tenosynovitis, right 10/29/2013    Priority: High    Class: Chronic   Past Medical History:  Diagnosis Date  . Arthritis   . Diabetes mellitus without complication (HCC)   . Neuropathy   . Seasonal allergies   . Wears glasses    reading    History reviewed. No pertinent family history.  Past Surgical History:  Procedure Laterality Date  . CARPAL TUNNEL RELEASE Right 10/29/2013   Procedure: RIGHT OPEN CARPAL TUNNEL RELEASE;  Surgeon: Kerrin Champagne, MD;  Location: [facility];  Service: Orthopedics;  Laterality: Right;  . DORSAL COMPARTMENT RELEASE Right 10/29/2013   Procedure: RIGHT DORSAL COMPARTMENT RELEASE (DEQUERVAIN);  Surgeon: Kerrin Champagne, MD;  Location: [facility];  Service: Orthopedics;  Laterality: Right;  . 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 Not on file  Tobacco Use  . Smoking status: Former Smoker    Last attempt to quit: 10/24/1982    Years since quitting: 35.1  . Smokeless tobacco: Never Used  Substance and Sexual Activity  . Alcohol use: No  . Drug use: No  . Sexual activity: Not on file

## 2017-11-21 NOTE — Addendum Note (Signed)
Addended by: Vira BrownsNITKA, Jaxxon on: 11/21/2017 12:46 PM   Modules accepted: Orders

## 2017-11-29 ENCOUNTER — Ambulatory Visit (HOSPITAL_COMMUNITY)
Admission: RE | Admit: 2017-11-29 | Discharge: 2017-11-29 | Disposition: A | Discharge status: Home / Self Care | Payer: Medicare HMO | Source: Ambulatory Visit | Attending: Specialist | Admitting: Specialist

## 2017-11-29 DIAGNOSIS — M5136 Other intervertebral disc degeneration, lumbar region: Secondary | ICD-10-CM | POA: Diagnosis not present

## 2017-11-29 DIAGNOSIS — M4807 Spinal stenosis, lumbosacral region: Secondary | ICD-10-CM

## 2017-12-02 ENCOUNTER — Encounter: Payer: Medicare HMO | Admitting: Vascular Surgery

## 2017-12-02 ENCOUNTER — Encounter: Payer: Self-pay | Admitting: Vascular Surgery

## 2017-12-02 ENCOUNTER — Ambulatory Visit: Payer: Medicare HMO | Admitting: Vascular Surgery

## 2017-12-02 VITALS — BP 147/89 | HR 84 | Resp 20 | Ht 68.0 in | Wt 171.0 lb

## 2017-12-02 DIAGNOSIS — I739 Peripheral vascular disease, unspecified: Secondary | ICD-10-CM | POA: Diagnosis not present

## 2017-12-02 NOTE — Progress Notes (Signed)
Referring Physician: Dr. Otelia SergeantNitka  Patient name: Austin Savage MRN: [id number] DOB: Jan 30, 1956 Sex: male  REASON FOR CONSULT: Lower extremity claudication  HPI: Austin MtJames Maclaughlin is a 62 y.o. male, is only seen by Dr. Otelia SergeantNitka for chronic back pain.  He was thought to have a component of pseudoclaudication secondary to his back but also some element of peripheral arterial disease.  Patient states that when he walks he gets a pain in his right calf that occurs after walking about 1-2 miles.  He does not really have significant symptoms in the left leg.  He is a former smoker.  He has diabetes.  His most recent A1c was less than 7.  He denies rest pain or nonhealing wounds.  Other medical problems include peripheral neuropathy and arthritis.  These are currently stable.  Past Medical History:  Diagnosis Date  . Arthritis   . Diabetes mellitus without complication (HCC)   . Neuropathy   . Seasonal allergies   . Wears glasses    reading   Past Surgical History:  Procedure Laterality Date  . CARPAL TUNNEL RELEASE Right 10/29/2013   Procedure: RIGHT OPEN CARPAL TUNNEL RELEASE;  Surgeon: Kerrin ChampagneJames E Nitka, MD;  Location: [facility];  Service: Orthopedics;  Laterality: Right;  . DORSAL COMPARTMENT RELEASE Right 10/29/2013   Procedure: RIGHT DORSAL COMPARTMENT RELEASE (DEQUERVAIN);  Surgeon: Kerrin ChampagneJames E Nitka, MD;  Location: [facility];  Service: Orthopedics;  Laterality: Right;  . 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 No pertinent family history.  SOCIAL HISTORY: Social History   Socioeconomic History  . Marital status: Married    Spouse name: Pearletha Alfredkatheren  . Number of children: 3  . Years of education: college  . Highest education level: Not on file  Occupational History  . Not on file  Social Needs  . Financial resource strain: Not on file  . Food insecurity:    Worry: Not on file    Inability: Not on file  . Transportation needs:      Medical: Not on file    Non-medical: Not on file  Tobacco Use  . Smoking status: Former Smoker    Last attempt to quit: 10/24/1982    Years since quitting: 35.1  . Smokeless tobacco: Never Used  Substance and Sexual Activity  . Alcohol use: No  . Drug use: No  . Sexual activity: Not on file  Lifestyle  . Physical activity:    Days per week: Not on file    Minutes per session: Not on file  . Stress: Not on file  Relationships  . Social connections:    Talks on phone: Not on file    Gets together: Not on file    Attends religious service: Not on file    Active member of club or organization: Not on file    Attends meetings of clubs or organizations: Not on file    Relationship status: Not on file  . Intimate partner violence:    Fear of current or ex partner: Not on file    Emotionally abused: Not on file    Physically abused: Not on file    Forced sexual activity: Not on file  Other Topics Concern  . Not on file  Social History Narrative  . Not on file    No Known Allergies  Current Outpatient Medications  Medication Sig Dispense Refill  . cetirizine (ZYRTEC) 10 MG tablet Take 10 mg  by mouth daily.    . ferrous sulfate 325 (65 FE) MG tablet Take 325 mg by mouth 2 (two) times daily with a meal.    . HYDROcodone-acetaminophen (NORCO) 5-325 MG per tablet Take 1-2 tablets by mouth every 4 (four) hours as needed for moderate pain. 30 tablet 0  . insulin aspart (NOVOLOG) 100 UNIT/ML FlexPen     . insulin glargine (LANTUS) 100 UNIT/ML injection     . gabapentin (NEURONTIN) 300 MG capsule Take 2 capsules (600 mg total) by mouth 3 (three) times daily. (Patient not taking: Reported on 12/02/2017) 180 capsule 12  . glimepiride (AMARYL) 4 MG tablet Take 4 mg by mouth daily.    . metFORMIN (GLUCOPHAGE) 500 MG tablet Take 500 mg by mouth daily.    . simvastatin (ZOCOR) 20 MG tablet Take 20 mg by mouth daily.     No current facility-administered medications for this visit.      ROS:   General:  No weight loss, Fever, chills  HEENT: No recent headaches, no nasal bleeding, no visual changes, no sore throat  Neurologic: No dizziness, blackouts, seizures. No recent symptoms of stroke or mini- stroke. No recent episodes of slurred speech, or temporary blindness.  Cardiac: No recent episodes of chest pain/pressure, no shortness of breath at rest.  No shortness of breath with exertion.  Denies history of atrial fibrillation or irregular heartbeat  Vascular: No history of rest pain in feet.  + history of claudication.  No history of non-healing ulcer, No history of DVT   Pulmonary: No home oxygen, no productive cough, no hemoptysis,  No asthma or wheezing  Musculoskeletal:  [X]  Arthritis, [X]  Low back pain,  [X]  Joint pain  Hematologic:No history of hypercoagulable state.  No history of easy bleeding.  No history of anemia  Gastrointestinal: No hematochezia or melena,  No gastroesophageal reflux, no trouble swallowing  Urinary: [ ]  chronic Kidney disease, [ ]  on HD - [ ]  MWF or [ ]  TTHS, [ ]  Burning with urination, [ ]  Frequent urination, [ ]  Difficulty urinating;   Skin: No rashes  Psychological: No history of anxiety,  No history of depression   Physical Examination  Vitals:   12/02/17 0954 12/02/17 0955  BP: (!) 149/83 (!) 147/89  Pulse: 84   Resp: 20   SpO2: 99%   Weight: 171 lb (77.6 kg)   Height: 5\' 8"  (1.727 m)     Body mass index is 26 kg/m.  General:  Alert and oriented, no acute distress HEENT: Normal Neck: No bruit or JVD Pulmonary: Clear to auscultation bilaterally Cardiac: Regular Rate and Rhythm without murmur Abdomen: Soft, non-tender, non-distended, no mass Skin: No rash or ulcer Extremity Pulses:  2+ radial, brachial, femoral, 1+ dorsalis pedis, absent posterior tibial pulses bilaterally Musculoskeletal: No deformity or edema  Neurologic: Upper and lower extremity motor 5/5 and symmetric  DATA:  Patient had bilateral ABIs  performed on November 21, 2017.  Right side was 0.8 left side 0.86 monophasic waveforms in the tibial region bilaterally suggestive of popliteal artery stenosis   ASSESSMENT: Mild lower extremity claudication right leg worse than left.  He currently is not at risk of limb loss.  He has a palpable pulse in both feet.  I would not consider an intervention currently for his symptoms of walking 1-2 miles.  PLAN: Gust with the patient risk factor modification control of his diabetes and continued walking program of 30 minutes daily.  He will follow-up with [place] in 1  year.  If his ABIs continue to decline over time or his walking distance decreased or he develop nonhealing wounds we would consider further investigation.  Otherwise we will manage him conservatively for now.   Fabienne Bruns, MD [facility] Office: [phone] Pager: [phone]

## 2017-12-08 ENCOUNTER — Ambulatory Visit (INDEPENDENT_AMBULATORY_CARE_PROVIDER_SITE_OTHER): Payer: Medicare HMO | Admitting: Specialist

## 2017-12-22 ENCOUNTER — Encounter (INDEPENDENT_AMBULATORY_CARE_PROVIDER_SITE_OTHER): Payer: Self-pay | Admitting: Specialist

## 2017-12-22 ENCOUNTER — Ambulatory Visit (INDEPENDENT_AMBULATORY_CARE_PROVIDER_SITE_OTHER): Payer: Medicare HMO | Admitting: Specialist

## 2017-12-22 VITALS — BP 146/80 | HR 83 | Ht 68.0 in | Wt 171.0 lb

## 2017-12-22 DIAGNOSIS — G8929 Other chronic pain: Secondary | ICD-10-CM | POA: Diagnosis not present

## 2017-12-22 DIAGNOSIS — M5136 Other intervertebral disc degeneration, lumbar region: Secondary | ICD-10-CM

## 2017-12-22 DIAGNOSIS — M5441 Lumbago with sciatica, right side: Secondary | ICD-10-CM

## 2017-12-22 DIAGNOSIS — M4726 Other spondylosis with radiculopathy, lumbar region: Secondary | ICD-10-CM | POA: Diagnosis not present

## 2017-12-22 NOTE — Patient Instructions (Signed)
Avoid bending, stooping and avoid lifting weights greater than 10 lbs. Avoid prolong standing and walking. Avoid frequent bending and stooping  No lifting greater than 10 lbs. May use ice or moist heat for pain. Weight loss is of benefit. Handicap license is approved. May want to try CBD oil orally or voltaren gel.  Biofreeze applied to  the areas of skin overlying the pain does sometimes help. Flexion exercises are recommended.  Arthritis medications like motrin, advil,alleve or arthrits strength tylenol are also of benefit in relieving pain.

## 2017-12-22 NOTE — Progress Notes (Signed)
Office Visit Note   Patient: Austin Savage           Date of Birth: 13-Jan-1956           MRN: [id number] Visit Date: 12/22/2017              Requested by: Arlina Robes, MD [address] [place], [place] [address] PCP: Arlina Robes, MD   Assessment & Plan: Visit Diagnoses:  1. Degenerative disc disease, lumbar   2. Chronic right-sided low back pain with right-sided sciatica   3. Other spondylosis with radiculopathy, lumbar region     Plan: Avoid bending, stooping and avoid lifting weights greater than 10 lbs. Avoid prolong standing and walking. Avoid frequent bending and stooping  No lifting greater than 10 lbs. May use ice or moist heat for pain. Weight loss is of benefit. Handicap license is approved. May want to try CBD oil orally or voltaren gel.  Biofreeze applied to  the areas of skin overlying the pain does sometimes help. Flexion exercises are recommended.  Arthritis medications like motrin, advil,alleve or arthrits strength tylenol are also of benefit in relieving pain.  Follow-Up Instructions: Return in about 3 months (around 03/24/2018).   Orders:  No orders of the defined types were placed in this encounter.  No orders of the defined types were placed in this encounter.     Procedures: No procedures performed   Clinical Data: Findings:  Findings on the recent Lumbar MRI 4/15 [facility] indicate mild subarticular narrowing right  L4-5, minimal right L5-S1 subchondral edema associated with the inferior endplate right L5. No significant stenosis. Minimal fluid signal within the right L4-5 face. Disc signals are normal, no central stenosis, no HNP.     Subjective: Chief Complaint  Patient presents with  . Lower Back - Follow-up    S/p MRI LSp    62 year old male with low back with leg pain that is intermittant, right side greater than left. Worse with bending, stooping and lifting. Stooping or standing in the kitchen  Washing dishes,  notices increased pain and has to go find a place to sit. The pain then improves and he is able to resume the standing activity after a while. No bowel or bladder difficulty. No pain cough or sneeze. No pain with twisting or turning or raking the yard.   Review of Systems  Constitutional: Negative.   HENT: Negative.   Eyes: Negative.  Negative for photophobia, pain, discharge, redness, itching and visual disturbance.  Respiratory: Negative.  Negative for apnea, cough, choking, chest tightness, shortness of breath, wheezing and stridor.   Cardiovascular: Negative.  Negative for chest pain and leg swelling.  Gastrointestinal: Negative.  Negative for abdominal distention, abdominal pain, anal bleeding, blood in stool, constipation, diarrhea, nausea and rectal pain.  Endocrine: Negative.   Genitourinary: Negative.  Negative for difficulty urinating, dysuria, enuresis, flank pain, frequency, genital sores and hematuria.  Musculoskeletal: Positive for back pain and neck pain. Negative for arthralgias, gait problem, joint swelling, myalgias and neck stiffness.  Skin: Negative.  Negative for color change, pallor, rash and wound.  Allergic/Immunologic: Negative.  Negative for environmental allergies and food allergies.  Neurological: Negative.  Negative for dizziness, tremors, seizures, syncope, facial asymmetry, speech difficulty, weakness, light-headedness, numbness and headaches.  Hematological: Negative.  Negative for adenopathy. Does not bruise/bleed easily.  Psychiatric/Behavioral: Negative.  Negative for agitation, behavioral problems, confusion, decreased concentration, dysphoric mood, hallucinations, self-injury, sleep disturbance and suicidal ideas. The patient is not  nervous/anxious and is not hyperactive.      Objective: Vital Signs: BP (!) 146/80 (BP Location: Left Arm, Patient Position: Sitting, Cuff Size: Normal)   Pulse 83   Ht  (1.727 m)   Wt 171 lb (77.6 kg)   BMI 26.00 kg/m     Physical Exam  Constitutional: He is oriented to person, place, and time. He appears well-developed and well-nourished.  HENT:  Head: Normocephalic and atraumatic.  Eyes: Pupils are equal, round, and reactive to light. EOM are normal.  Neck: Normal range of motion. Neck supple.  Pulmonary/Chest: Effort normal and breath sounds normal.  Abdominal: Soft. Bowel sounds are normal.  Neurological: He is alert and oriented to person, place, and time.  Skin: Skin is warm and dry.  Psychiatric: He has a normal mood and affect. His behavior is normal. Judgment and thought content normal.    Back Exam   Tenderness  The patient is experiencing tenderness in the lumbar.  Range of Motion  Extension: abnormal  Flexion: normal  Lateral bend right: normal  Lateral bend left: normal  Rotation right: normal  Rotation left: normal   Muscle Strength  Right Quadriceps:  5/5  Right Hamstrings:  5/5   Tests  Straight leg raise right: negative Straight leg raise left: negative  Reflexes  Patellar: normal Achilles: normal Biceps: normal Babinski's sign: normal   Other  Toe walk: normal Heel walk: normal Sensation: normal Gait: normal       Specialty Comments:  No specialty comments available.  Imaging: No results found.   PMFS History: Patient Active Problem List   Diagnosis Date Noted  . Carpal tunnel syndrome of right wrist 10/29/2013    Priority: High    Class: Chronic  . De Quervain's tenosynovitis, right 10/29/2013    Priority: High    Class: Chronic   Past Medical History:  Diagnosis Date  . Arthritis   . Diabetes mellitus without complication (HCC)   . Neuropathy   . Seasonal allergies   . Wears glasses    reading    History reviewed. No pertinent family history.  Past Surgical History:  Procedure Laterality Date  . CARPAL TUNNEL RELEASE Right 10/29/2013   Procedure: RIGHT OPEN CARPAL TUNNEL RELEASE;  Surgeon: Kerrin Champagne, MD;  Location: [facility];  Service: Orthopedics;  Laterality: Right;  . DORSAL COMPARTMENT RELEASE Right 10/29/2013   Procedure: RIGHT DORSAL COMPARTMENT RELEASE (DEQUERVAIN);  Surgeon: Kerrin Champagne, MD;  Location: [facility];  Service: Orthopedics;  Laterality: Right;  . 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 Not on file  Tobacco Use  . Smoking status: Former Smoker    Last attempt to quit: 10/24/1982    Years since quitting: 35.1  . Smokeless tobacco: Never Used  Substance and Sexual Activity  . Alcohol use: No  . Drug use: No  . Sexual activity: Not on file

## 2018-02-24 ENCOUNTER — Other Ambulatory Visit (INDEPENDENT_AMBULATORY_CARE_PROVIDER_SITE_OTHER): Payer: Self-pay | Admitting: Specialist

## 2018-02-24 MED ORDER — GABAPENTIN 300 MG PO CAPS: 600.0000 mg | ORAL_CAPSULE | Freq: Three times a day (TID) | ORAL | 12 refills | Status: DC

## 2018-05-31 ENCOUNTER — Other Ambulatory Visit (INDEPENDENT_AMBULATORY_CARE_PROVIDER_SITE_OTHER): Payer: Self-pay | Admitting: Specialist

## 2018-06-01 NOTE — Telephone Encounter (Signed)
Gabapentin refill

## 2018-06-06 NOTE — Telephone Encounter (Signed)
Called to [place], [place], [place]

## 2018-06-28 ENCOUNTER — Ambulatory Visit (INDEPENDENT_AMBULATORY_CARE_PROVIDER_SITE_OTHER): Payer: Medicare HMO | Admitting: Specialist

## 2018-07-28 ENCOUNTER — Ambulatory Visit (INDEPENDENT_AMBULATORY_CARE_PROVIDER_SITE_OTHER): Payer: Medicare HMO | Admitting: Specialist

## 2018-09-07 ENCOUNTER — Ambulatory Visit (INDEPENDENT_AMBULATORY_CARE_PROVIDER_SITE_OTHER): Payer: Medicare PPO | Admitting: Specialist

## 2018-09-07 ENCOUNTER — Ambulatory Visit (INDEPENDENT_AMBULATORY_CARE_PROVIDER_SITE_OTHER): Payer: Self-pay

## 2018-09-07 ENCOUNTER — Encounter (INDEPENDENT_AMBULATORY_CARE_PROVIDER_SITE_OTHER): Payer: Self-pay | Admitting: Specialist

## 2018-09-07 VITALS — BP 160/83 | HR 93 | Ht 68.0 in | Wt 171.0 lb

## 2018-09-07 DIAGNOSIS — M4712 Other spondylosis with myelopathy, cervical region: Secondary | ICD-10-CM

## 2018-09-07 DIAGNOSIS — M5136 Other intervertebral disc degeneration, lumbar region: Secondary | ICD-10-CM

## 2018-09-07 DIAGNOSIS — M19042 Primary osteoarthritis, left hand: Secondary | ICD-10-CM | POA: Diagnosis not present

## 2018-09-07 DIAGNOSIS — M51369 Other intervertebral disc degeneration, lumbar region without mention of lumbar back pain or lower extremity pain: Secondary | ICD-10-CM

## 2018-09-07 MED ORDER — DICLOFENAC SODIUM 1 % TD GEL: 4.0000 g | Freq: Four times a day (QID) | TRANSDERMAL | 3 refills | Status: DC

## 2018-09-07 NOTE — Patient Instructions (Addendum)
Plan: Avoid frequent bending and stooping  No lifting greater than 10 lbs. May use ice or moist heat for pain. Weight loss is of benefit. Best medication for lumbar disc disease is arthritis medications like tylenol or cymbalta. Exercise is important to improve your indurance and does allow people to function better inspite of back pain. Unable to take NSAIDS due to renal insuff. Try Hemp CBD Oil capsules 5,000-7,000mg  per bottle, 60 capsules per bottle, amazon.com, take 2 tablets po daily.

## 2018-09-07 NOTE — Progress Notes (Signed)
Office Visit Note   Patient: Austin Savage           Date of Birth: Jul 20, 1956           MRN: [id number] Visit Date: 09/07/2018              Requested by: [name], [name], MD [address] [place], [place] [id number] PCP: [name], [name], MD   Assessment & Plan: Visit Diagnoses:  1. Degenerative disc disease, lumbar   2. Primary osteoarthritis, left hand     Plan: Avoid frequent bending and stooping  No lifting greater than 10 lbs. May use ice or moist heat for pain. Weight loss is of benefit. Best medication for lumbar disc disease is arthritis medications like tylenol or cymbalta. Exercise is important to improve your indurance and does allow people to function better inspite of back pain. Unable to take NSAIDS due to renal insuff. Try Hemp CBD Oil capsules 5,000-7,000mg  per bottle, 60 capsules per bottle, [email or web address], take 2 tablets po daily.    Follow-Up Instructions: No follow-ups on file.   Orders:  Orders Placed This Encounter  Procedures  . XR Lumbar Spine 2-3 Views   No orders of the defined types were placed in this encounter.     Procedures: No procedures performed   Clinical Data: No additional findings.   Subjective: Chief Complaint  Patient presents with  . Lower Back - Follow-up    63 year old left handed male with history of cervical spondylosis and arthrosis of the lumbar and bilateral hand stiffness and pain. He notices awakening with bilateral hand pain and difficulty making a grip and strength intermittantly is affected. Has intermittant leg pain like sciatica. He is able to sleep well.    Review of Systems  Constitutional: Negative.   HENT: Negative.   Eyes: Negative.   Respiratory: Negative.   Cardiovascular: Negative.   Gastrointestinal: Negative.   Endocrine: Negative.   Genitourinary: Negative.   Musculoskeletal: Negative.   Skin: Negative.   Allergic/Immunologic: Negative.   Neurological: Negative.     Hematological: Negative.   Psychiatric/Behavioral: Negative.      Objective: Vital Signs: BP (!) 160/83 (BP Location: Left Arm, Patient Position: Sitting)   Pulse 93   Ht 5\' 8"  (1.727 m)   Wt 171 lb (77.6 kg)   BMI 26.00 kg/m   Physical Exam Constitutional:      Appearance: He is well-developed.  HENT:     Head: Normocephalic and atraumatic.  Eyes:     Pupils: Pupils are equal, round, and reactive to light.  Neck:     Musculoskeletal: Normal range of motion and neck supple.  Pulmonary:     Effort: Pulmonary effort is normal.     Breath sounds: Normal breath sounds.  Abdominal:     General: Bowel sounds are normal.     Palpations: Abdomen is soft.  Skin:    General: Skin is warm and dry.  Neurological:     Mental Status: He is alert and oriented to person, place, and time.  Psychiatric:        Behavior: Behavior normal.        Thought Content: Thought content normal.        Judgment: Judgment normal.     Back Exam   Tenderness  The patient is experiencing tenderness in the lumbar and cervical.  Range of Motion  Extension: abnormal  Flexion: abnormal  Lateral bend right: normal  Lateral bend left: normal  Rotation right:  normal  Rotation left: normal   Muscle Strength  Right Quadriceps:  5/5  Left Quadriceps:  5/5  Right Hamstrings:  5/5  Left Hamstrings:  5/5   Tests  Straight leg raise right: negative Straight leg raise left: negative  Reflexes  Patellar: normal Achilles: normal Biceps: normal Babinski's sign: normal   Other  Toe walk: normal Heel walk: normal Sensation: normal Gait: abnormal  Erythema: no back redness Scars: absent      Specialty Comments:  No specialty comments available.  Imaging: No results found.   PMFS History: Patient Active Problem List   Diagnosis Date Noted  . Carpal tunnel syndrome of right wrist 10/29/2013    Priority: High    Class: Chronic  . De Quervain's tenosynovitis, right 10/29/2013     Priority: High    Class: Chronic   Past Medical History:  Diagnosis Date  . Arthritis   . Diabetes mellitus without complication (HCC)   . Neuropathy   . Seasonal allergies   . Wears glasses    reading    No family history on file.  Past Surgical History:  Procedure Laterality Date  . CARPAL TUNNEL RELEASE Right 10/29/2013   Procedure: RIGHT OPEN CARPAL TUNNEL RELEASE;  Surgeon: Kerrin Champagne, MD;  Location: [facility];  Service: Orthopedics;  Laterality: Right;  . DORSAL COMPARTMENT RELEASE Right 10/29/2013   Procedure: RIGHT DORSAL COMPARTMENT RELEASE (DEQUERVAIN);  Surgeon: Kerrin Champagne, MD;  Location: [facility];  Service: Orthopedics;  Laterality: Right;  . 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 Not on file  Tobacco Use  . Smoking status: Former Smoker    Last attempt to quit: 10/24/1982    Years since quitting: 35.8  . Smokeless tobacco: Never Used  Substance and Sexual Activity  . Alcohol use: No  . Drug use: No  . Sexual activity: Not on file

## 2018-12-09 ENCOUNTER — Other Ambulatory Visit (INDEPENDENT_AMBULATORY_CARE_PROVIDER_SITE_OTHER): Payer: Self-pay | Admitting: Specialist

## 2018-12-11 NOTE — Telephone Encounter (Signed)
Gabapentin refill request 

## 2019-01-12 ENCOUNTER — Other Ambulatory Visit: Payer: Self-pay | Admitting: Specialist

## 2019-01-12 MED ORDER — GABAPENTIN 300 MG PO CAPS: 600.0000 mg | ORAL_CAPSULE | Freq: Three times a day (TID) | ORAL | 0 refills | Status: DC

## 2019-01-12 NOTE — Telephone Encounter (Signed)
Pt called in said he need a refill of his medication for gabapentin 300mg  and have that sent to [facility] pharmacy on [address] in [place].  [phone]

## 2019-02-14 ENCOUNTER — Other Ambulatory Visit: Payer: Self-pay | Admitting: Specialist

## 2019-02-14 NOTE — Telephone Encounter (Signed)
Patient called requesting refill Gabapentin. States will not be here for another year and needs this filled asap.  Please call patient [phone]

## 2019-02-14 NOTE — Telephone Encounter (Signed)
I sent request to Dr. Nitka 

## 2019-02-15 MED ORDER — GABAPENTIN 300 MG PO CAPS: 600.0000 mg | ORAL_CAPSULE | Freq: Three times a day (TID) | ORAL | 0 refills | Status: DC

## 2019-03-16 ENCOUNTER — Other Ambulatory Visit: Payer: Self-pay | Admitting: Specialist

## 2019-03-16 NOTE — Telephone Encounter (Signed)
Gabapentin refill request 

## 2019-04-16 ENCOUNTER — Other Ambulatory Visit: Payer: Self-pay | Admitting: Specialist

## 2019-04-16 NOTE — Telephone Encounter (Signed)
Gabapentin refill request 

## 2019-05-17 ENCOUNTER — Other Ambulatory Visit: Payer: Self-pay | Admitting: Specialist

## 2019-05-24 ENCOUNTER — Other Ambulatory Visit: Payer: Self-pay

## 2019-05-24 DIAGNOSIS — I739 Peripheral vascular disease, unspecified: Secondary | ICD-10-CM

## 2019-05-29 ENCOUNTER — Telehealth (HOSPITAL_COMMUNITY): Payer: Self-pay

## 2019-05-29 NOTE — Telephone Encounter (Signed)

## 2019-05-30 ENCOUNTER — Encounter: Payer: Self-pay | Admitting: Family

## 2019-05-30 ENCOUNTER — Ambulatory Visit (HOSPITAL_COMMUNITY)
Admission: RE | Admit: 2019-05-30 | Discharge: 2019-05-30 | Disposition: A | Discharge status: Home / Self Care | Payer: Medicare PPO | Source: Ambulatory Visit | Attending: Family | Admitting: Family

## 2019-05-30 ENCOUNTER — Ambulatory Visit: Payer: Medicare PPO | Admitting: Family

## 2019-05-30 ENCOUNTER — Other Ambulatory Visit: Payer: Self-pay

## 2019-05-30 VITALS — BP 151/86 | HR 90 | Temp 97.4°F | Resp 20 | Ht 68.0 in | Wt 179.9 lb

## 2019-05-30 DIAGNOSIS — I739 Peripheral vascular disease, unspecified: Secondary | ICD-10-CM | POA: Insufficient documentation

## 2019-05-30 DIAGNOSIS — I779 Disorder of arteries and arterioles, unspecified: Secondary | ICD-10-CM

## 2019-05-30 DIAGNOSIS — Z87891 Personal history of nicotine dependence: Secondary | ICD-10-CM | POA: Diagnosis not present

## 2019-05-30 DIAGNOSIS — E1151 Type 2 diabetes mellitus with diabetic peripheral angiopathy without gangrene: Secondary | ICD-10-CM | POA: Diagnosis not present

## 2019-05-30 NOTE — Progress Notes (Signed)
[facility]   CC: Follow up peripheral artery occlusive disease  History of Present Illness Austin Savage is a 63 y.o. male who was seen by Dr. Louanne Skye for chronic back pain.  He was thought to have a component of pseudoclaudication secondary to his back but also some element of peripheral arterial disease.  Dr. Oneida Alar initially evaluated pt on 12-02-17. At that time pt has mild lower extremity claudication right leg worse than left.  He was not at risk of limb loss.  He had a palpable pulse in both feet. Dr. Oneida Alar indicated that he would not consider an intervention for his symptoms of walking 1-2 miles. Dr. Oneida Alar discussed with the patient risk factor modification control of his diabetes and continued walking program of 30 minutes daily.  Pt was to follow-up with [place] in 1 year.  If his ABIs continue to decline over time or his walking distance decreased or he develop nonhealing wounds we would consider further investigation.  Otherwise we will manage him conservatively for now.  He walks a total of 1/2 to 1.5 miles daily, denies any claudication type symptoms.   He denies any history of stroke or TIA.    Diabetic: Yes, states his last A1C was 7.0, states his blood sugars have improved since then Tobacco use: former smoker, quit in 1984  Pt meds include: Statin :Yes Betablocker: No ASA: yes, pt states his PCP advised him to take Other anticoagulants/antiplatelets: Eliquis for PE, pt states since this he was advised not to take long walks He was seen in the ED in [place] in September 2020 for evaluation of severe fatigue and syncope, then PE found  Past Medical History:  Diagnosis Date  . Arthritis   . Diabetes mellitus without complication ([place])   . Neuropathy   . Seasonal allergies   . Wears glasses    reading    Social History Social History   Tobacco Use  . Smoking status: Former Smoker    Quit date: 10/24/1982    Years since quitting: 36.6   . Smokeless tobacco: Never Used  Substance Use Topics  . Alcohol use: No  . Drug use: No    Family History History reviewed. No pertinent family history.  Past Surgical History:  Procedure Laterality Date  . CARPAL TUNNEL RELEASE Right 10/29/2013   Procedure: RIGHT OPEN CARPAL TUNNEL RELEASE;  Surgeon: Jessy Oto, MD;  Location: [facility];  Service: Orthopedics;  Laterality: Right;  . DORSAL COMPARTMENT RELEASE Right 10/29/2013   Procedure: RIGHT DORSAL COMPARTMENT RELEASE (DEQUERVAIN);  Surgeon: Jessy Oto, MD;  Location: [facility];  Service: Orthopedics;  Laterality: Right;  . FINGER AMPUTATION  2000   accidental middle right finger    No Known Allergies  Current Outpatient Medications  Medication Sig Dispense Refill  . amLODipine (NORVASC) 5 MG tablet Take 5 mg by mouth daily.    Marland Kitchen desonide (DESOWEN) 0.05 % cream     . diclofenac sodium (VOLTAREN) 1 % GEL Apply 4 g topically 4 (four) times daily. 3 Tube 3  . ELIQUIS 5 MG TABS tablet TAKE 1 TABLET BY MOUTH TWICE DAILY STARTING 09 23 2020 IN THE EVENING    . ferrous sulfate 325 (65 FE) MG tablet Take 325 mg by mouth 2 (two) times daily with a meal.    . fexofenadine (ALLEGRA) 180 MG tablet TAKE 1 TABLET BY MOUTH ONCE DAILY    . gabapentin (NEURONTIN) 300 MG  capsule TAKE 2 CAPSULES BY MOUTH THREE TIMES DAILY 90 capsule 0  . glimepiride (AMARYL) 4 MG tablet Take 4 mg by mouth daily.    . insulin aspart (NOVOLOG) 100 UNIT/ML FlexPen     . insulin glargine (LANTUS) 100 UNIT/ML injection     . lisinopril (ZESTRIL) 20 MG tablet Take 20 mg by mouth daily.    . metFORMIN (GLUCOPHAGE) 500 MG tablet Take 500 mg by mouth daily.    . simvastatin (ZOCOR) 20 MG tablet Take 20 mg by mouth daily.     No current facility-administered medications for this visit.     ROS: See HPI for pertinent positives and negatives.   Physical Examination  Vitals:   05/30/19 1025  BP: (!) 151/86  Pulse: 90  Resp:  20  Temp: (!) 97.4 F (36.3 C)  SpO2: 98%  Weight: 179 lb 14.4 oz (81.6 kg)  Height: 5\' 8"  (1.727 m)   Body mass index is 27.35 kg/m.  General: A&O x 3, WDWN, male in NAD. Gait: normal HEENT: No gross abnormalities.  Pulmonary: Respirations are non labored, CTAB, fair air movement in all fields Cardiac: regular rhythm, no detected murmur.         Carotid Bruits Right Left   Negative Negative   Radial pulses are 2+ palpable bilaterally   Adominal aortic pulse is not palpable                         VASCULAR EXAM: Extremities without ischemic changes, without Gangrene; without open wounds.                                                                                                          LE Pulses Right Left       FEMORAL  2+ palpable  2+ palpable        POPLITEAL  not palpable   not palpable       POSTERIOR TIBIAL  not palpable   not palpable        DORSALIS PEDIS      ANTERIOR TIBIAL not palpable  not palpable    Abdomen: soft, NT, no palpable masses. Skin: no rashes, no cellulitis, no ulcers noted. Musculoskeletal: no muscle wasting or atrophy.  Neurologic: A&O X 3; appropriate affect, Sensation is normal; MOTOR FUNCTION:  moving all extremities equally, motor strength 5/5 throughout. Speech is fluent/normal. CN 2-12 intact. Psychiatric: Thought content is normal, mood appropriate for clinical situation.     DATA  ABI (Date: 05/30/2019): ABI Findings: +---------+------------------+-----+----------+--------+ Right    Rt Pressure (mmHg)IndexWaveform  Comment  +---------+------------------+-----+----------+--------+ Brachial 182                                       +---------+------------------+-----+----------+--------+ PTA      152               0.81 monophasic         +---------+------------------+-----+----------+--------+ DP  255               1.36 monophasic          +---------+------------------+-----+----------+--------+ Great Toe79                0.42                    +---------+------------------+-----+----------+--------+  +---------+------------------+-----+----------+-------+ Left     Lt Pressure (mmHg)IndexWaveform  Comment +---------+------------------+-----+----------+-------+ Brachial 188                                      +---------+------------------+-----+----------+-------+ PTA      167               0.89 monophasic        +---------+------------------+-----+----------+-------+ DP       173               0.92 monophasic        +---------+------------------+-----+----------+-------+ Great Toe104               0.55                   +---------+------------------+-----+----------+-------+  +-------+-----------+-----------+------------+------------+ ABI/TBIToday's ABIToday's TBIPrevious ABIPrevious TBI +-------+-----------+-----------+------------+------------+ Right  [place]         0.42       0.79        0.53         +-------+-----------+-----------+------------+------------+ Left   0.92       0.55       0.86        0.41         +-------+-----------+-----------+------------+------------+  Arterial wall calcification precludes accurate ankle pressures and ABIs.   Summary: Right: Resting right ankle-brachial index indicates noncompressible right lower extremity arteries. The right toe-brachial index is abnormal. RT great toe pressure = 79 mmHg.  Left: Resting left ankle-brachial index indicates mild left lower extremity arterial disease. The left toe-brachial index is abnormal. LT Great toe pressure = 104 mmHg.   ASSESSMENT: Austin Savage is a 63 y.o. male who presents with no claudication symptoms, no signs of ischemia in his feet or legs. He walks 0.5 to 1.5 miles daily.   ABI's indicate non compressible vessels bilaterally, all monophasic waveforms.   His  atherosclerotic risk factors include DM, former smoker, and hypertension.  He had pulmonary emboli discovered in September 2020, and has been on Eliquis since then. He also takes a daily 81 mg ASA and a statin.    PLAN:  Based on the patient's vascular studies and examination, pt will return to clinic in 6 months with ABI's.  I advised him to notify us if he develops pain or fatigue in the muscles of his legs when he walks, or if he develops non healing wounds in his feet or legs. Walk a total of at least 30 minutes daily.   I discussed in depth with the patient the nature of atherosclerosis, and emphasized the importance of maximal medical management including strict control of blood pressure, blood glucose, and lipid levels, obtaining regular exercise, and continued cessation of smoking.  The patient is aware that without maximal medical management the underlying atherosclerotic disease process will progress, limiting the benefit of any interventions.  The patient was given information about PAD including signs, symptoms, treatment, what symptoms should prompt the patient to seek immediate medical care, and risk reduction measures to take.  Charisse March, RN, MSN, FNP-C [facility] Phone: [phone]  Clinic MD: [facility]  05/30/19 10:45 AM

## 2019-05-30 NOTE — Patient Instructions (Signed)
Peripheral Vascular Disease  Peripheral vascular disease (PVD) is a disease of the blood vessels that are not part of your heart and brain. A simple term for PVD is poor circulation. In most cases, PVD narrows the blood vessels that carry blood from your heart to the rest of your body. This can reduce the supply of blood to your arms, legs, and internal organs, like your stomach or kidneys. However, PVD most often affects a person's lower legs and feet. Without treatment, PVD tends to get worse. PVD can also lead to acute ischemic limb. This is when an arm or leg suddenly cannot get enough blood. This is a medical emergency. Follow these instructions at home: Lifestyle  Do not use any products that contain nicotine or tobacco, such as cigarettes and e-cigarettes. If you need help quitting, ask your doctor.  Lose weight if you are overweight. Or, stay at a healthy weight as told by your doctor.  Eat a diet that is low in fat and cholesterol. If you need help, ask your doctor.  Exercise regularly. Ask your doctor for activities that are right for you. General instructions  Take over-the-counter and prescription medicines only as told by your doctor.  Take good care of your feet: ? Wear comfortable shoes that fit well. ? Check your feet often for any cuts or sores.  Keep all follow-up visits as told by your doctor This is important. Contact a doctor if:  You have cramps in your legs when you walk.  You have leg pain when you are at rest.  You have coldness in a leg or foot.  Your skin changes.  You are unable to get or have an erection (erectile dysfunction).  You have cuts or sores on your feet that do not heal. Get help right away if:  Your arm or leg turns cold, numb, and blue.  Your arms or legs become red, warm, swollen, painful, or numb.  You have chest pain.  You have trouble breathing.  You suddenly have weakness in your face, arm, or leg.  You become very  confused or you cannot speak.  You suddenly have a very bad headache.  You suddenly cannot see. Summary  Peripheral vascular disease (PVD) is a disease of the blood vessels.  A simple term for PVD is poor circulation. Without treatment, PVD tends to get worse.  Treatment may include exercise, low fat and low cholesterol diet, and quitting smoking. This information is not intended to replace advice given to you by your health care provider. Make sure you discuss any questions you have with your health care provider. Document Released: 11/03/2009 Document Revised: 07/22/2017 Document Reviewed: 09/16/2016 Elsevier Patient Education  2020 Elsevier Inc.  

## 2019-06-15 ENCOUNTER — Other Ambulatory Visit: Payer: Self-pay | Admitting: Specialist

## 2019-06-15 NOTE — Telephone Encounter (Signed)
Ok to rf? 

## 2019-06-20 NOTE — Telephone Encounter (Signed)
Okay to refill number 40 tablets and needs ROV with Dr. Louanne Skye

## 2019-06-21 ENCOUNTER — Other Ambulatory Visit: Payer: Self-pay | Admitting: Specialist

## 2019-06-21 ENCOUNTER — Other Ambulatory Visit: Payer: Self-pay | Admitting: Radiology

## 2019-06-21 ENCOUNTER — Telehealth: Payer: Self-pay | Admitting: Specialist

## 2019-06-21 MED ORDER — GABAPENTIN 300 MG PO CAPS
600.0000 mg | ORAL_CAPSULE | Freq: Three times a day (TID) | ORAL | 3 refills | Status: DC
Start: 2019-06-21 — End: 2020-03-03

## 2019-06-21 MED ORDER — GABAPENTIN 300 MG PO CAPS: 600.0000 mg | ORAL_CAPSULE | Freq: Three times a day (TID) | ORAL | 3 refills | Status: DC

## 2019-06-21 NOTE — Telephone Encounter (Signed)
Request has been sent to Dr. Nitka 

## 2019-06-21 NOTE — Telephone Encounter (Signed)
Patient called requesting an RX for his Gabapentin.  He is wanting to know if he can have open refills instead of having to call every month.  CB#[phone].  Thank you.

## 2019-07-24 ENCOUNTER — Telehealth: Payer: Self-pay | Admitting: Specialist

## 2019-07-24 NOTE — Telephone Encounter (Signed)
Returned call to patient left message to call back per request to schedule an appointment with Dr Louanne Skye  for back pain

## 2019-09-06 ENCOUNTER — Ambulatory Visit (INDEPENDENT_AMBULATORY_CARE_PROVIDER_SITE_OTHER): Payer: Medicare PPO | Admitting: Specialist

## 2019-09-06 ENCOUNTER — Encounter: Payer: Self-pay | Admitting: Specialist

## 2019-09-06 ENCOUNTER — Ambulatory Visit (INDEPENDENT_AMBULATORY_CARE_PROVIDER_SITE_OTHER): Payer: Medicare PPO

## 2019-09-06 ENCOUNTER — Other Ambulatory Visit: Payer: Self-pay

## 2019-09-06 VITALS — BP 138/71 | HR 89 | Ht 68.0 in | Wt 180.0 lb

## 2019-09-06 DIAGNOSIS — M5136 Other intervertebral disc degeneration, lumbar region: Secondary | ICD-10-CM | POA: Diagnosis not present

## 2019-09-06 DIAGNOSIS — M778 Other enthesopathies, not elsewhere classified: Secondary | ICD-10-CM

## 2019-09-06 DIAGNOSIS — M7541 Impingement syndrome of right shoulder: Secondary | ICD-10-CM | POA: Diagnosis not present

## 2019-09-06 DIAGNOSIS — M25511 Pain in right shoulder: Secondary | ICD-10-CM | POA: Diagnosis not present

## 2019-09-06 DIAGNOSIS — M7501 Adhesive capsulitis of right shoulder: Secondary | ICD-10-CM

## 2019-09-06 MED ORDER — TRAMADOL HCL 50 MG PO TABS: 50.0000 mg | ORAL_TABLET | Freq: Four times a day (QID) | ORAL | 0 refills | Status: AC | PRN

## 2019-09-06 NOTE — Progress Notes (Signed)
Office Visit Note   Patient: Austin Savage           Date of Birth: Nov 03, 1955           MRN: [id number] Visit Date: 09/06/2019              Requested by: Andres Shad, MD [address] [place] [address] PCP: Andres Shad, MD   Assessment & Plan: Visit Diagnoses:  1. Degenerative disc disease, lumbar   2. Right shoulder pain, unspecified chronicity     Plan: Avoid overhead lifting and overhead use of the arms. Do not lift greater than 5 lbs. Adjust head rest in vehicle to prevent hyperextension if rear ended. Take extra precautions to avoid falling, including use of a cane if you feel weak. Avoid bending, stooping and avoid lifting weights greater than 10 lbs. Avoid prolong standing and walking. Avoid frequent bending and stooping  No lifting greater than 10 lbs. May use ice or moist heat for pain. Weight loss is of benefit. Handicap license is approved. With the use of blood thinners for the pulmonary emboli you should not take NSAIDs like  Motrin, naprosyn, diclofenac or meloxicam. Tylenol is okay but you need to limit the amount to less than 3,000 mg per day more than this may Injure the liver.  Hot showers in the AM.   Hemp CBD capsules, amazon.com 5,000-7,000 mg per bottle, 60 capsules per bottle, take one capsule twice a day. Cane in the left hand to use with left leg weight bearing. Follow-Up Instructions: No follow-ups on file.  Right shoulder therapy to improve ROM and strength.  Follow-Up Instructions: No follow-ups on file.   Follow-Up Instructions: No follow-ups on file.   Orders:  Orders Placed This Encounter  Procedures  . XR Lumbar Spine 2-3 Views  . XR Shoulder Right   No orders of the defined types were placed in this encounter.     Procedures: No procedures performed   Clinical Data: No additional findings.   Subjective: Chief Complaint  Patient presents with  . Right Shoulder - Pain  . Lower Back - Pain     64 year old left handed male with history of cervical spondylosis and right shoulder pain. He also has lumbar DDD And spondylosis and he reports the right arm aches like a tooth ache. Standing he has low back pain and pain goes into the buttocks thighs and even into the legs. No bowel or bladder difficulty. He has stiffness with riding a car, he lives in [place]. He takes arthritis strength for his pain. No numbness or tingling in the legs, just discomfort that is aching once in a while. His back was doing good til about September. Walking or out in the yard can increase back pain. He had to change his tire with almost blacking out when working on the car.   Review of Systems  Constitutional: Negative.   HENT: Negative.   Eyes: Negative.   Respiratory: Negative.   Cardiovascular: Negative.   Gastrointestinal: Negative.   Endocrine: Negative.   Genitourinary: Negative.   Musculoskeletal: Negative.   Skin: Negative.   Allergic/Immunologic: Negative.   Neurological: Negative.   Hematological: Negative.   Psychiatric/Behavioral: Negative.      Objective: Vital Signs: BP 138/71 (BP Location: Left Arm, Patient Position: Sitting)   Pulse 89   Ht 5\' 8"  (1.727 m)   Wt 180 lb (81.6 kg)   BMI 27.37 kg/m   Physical Exam Constitutional:  Appearance: He is well-developed.  HENT:     Head: Normocephalic and atraumatic.  Eyes:     Pupils: Pupils are equal, round, and reactive to light.  Pulmonary:     Effort: Pulmonary effort is normal.     Breath sounds: Normal breath sounds.  Abdominal:     General: Bowel sounds are normal.     Palpations: Abdomen is soft.  Musculoskeletal:     Cervical back: Normal range of motion and neck supple.     Lumbar back: Negative right straight leg raise test and negative left straight leg raise test.  Skin:    General: Skin is warm and dry.  Neurological:     Mental Status: He is alert and oriented to person, place, and time.  Psychiatric:         Behavior: Behavior normal.        Thought Content: Thought content normal.        Judgment: Judgment normal.     Back Exam   Tenderness  The patient is experiencing tenderness in the lumbar.  Range of Motion  Extension: abnormal  Flexion: abnormal  Lateral bend right: abnormal  Lateral bend left: abnormal  Rotation right: abnormal  Rotation left: abnormal   Muscle Strength  Right Quadriceps:  5/5  Left Quadriceps:  5/5  Right Hamstrings:  5/5  Left Hamstrings:  5/5   Tests  Straight leg raise right: negative Straight leg raise left: negative  Reflexes  Patellar: 0/4 Achilles: 0/4  Other  Toe walk: normal Heel walk: normal Sensation: normal Gait: normal  Erythema: no back redness Scars: absent      Specialty Comments:  No specialty comments available.  Imaging: No results found.   PMFS History: Patient Active Problem List   Diagnosis Date Noted  . Carpal tunnel syndrome of right wrist 10/29/2013    Priority: High    Class: Chronic  . De Quervain's tenosynovitis, right 10/29/2013    Priority: High    Class: Chronic   Past Medical History:  Diagnosis Date  . Arthritis   . Diabetes mellitus without complication (HCC)   . Neuropathy   . Seasonal allergies   . Wears glasses    reading    History reviewed. No pertinent family history.  Past Surgical History:  Procedure Laterality Date  . CARPAL TUNNEL RELEASE Right 10/29/2013   Procedure: RIGHT OPEN CARPAL TUNNEL RELEASE;  Surgeon: Kerrin Champagne, MD;  Location: [facility];  Service: Orthopedics;  Laterality: Right;  . DORSAL COMPARTMENT RELEASE Right 10/29/2013   Procedure: RIGHT DORSAL COMPARTMENT RELEASE (DEQUERVAIN);  Surgeon: Kerrin Champagne, MD;  Location: [facility];  Service: Orthopedics;  Laterality: Right;  . 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 Not on file  Tobacco Use  . Smoking status:  Former Smoker    Quit date: 10/24/1982    Years since quitting: 36.8  . Smokeless tobacco: Never Used  Substance and Sexual Activity  . Alcohol use: No  . Drug use: No  . Sexual activity: Not on file

## 2019-09-06 NOTE — Patient Instructions (Addendum)
Avoid overhead lifting and overhead use of the arms. Do not lift greater than 5 lbs. Adjust head rest in vehicle to prevent hyperextension if rear ended. Take extra precautions to avoid falling, including use of a cane if you feel weak. Avoid bending, stooping and avoid lifting weights greater than 10 lbs. Avoid prolong standing and walking. Avoid frequent bending and stooping  No lifting greater than 10 lbs. May use ice or moist heat for pain. Weight loss is of benefit. Handicap license is approved. With the use of blood thinners for the pulmonary emboli you should not take NSAIDs like  Motrin, naprosyn, diclofenac or meloxicam. Tylenol is okay but you need to limit the amount to less than 3,000 mg per day more than this may Injure the liver.  Hot showers in the AM.   Hemp CBD capsules, amazon.com 5,000-7,000 mg per bottle, 60 capsules per bottle, take one capsule twice a day. Cane in the left hand to use with left leg weight bearing. Follow-Up Instructions: No follow-ups on file.  Right shoulder therapy to improve ROM and strength.

## 2019-09-07 ENCOUNTER — Other Ambulatory Visit (INDEPENDENT_AMBULATORY_CARE_PROVIDER_SITE_OTHER): Payer: Self-pay | Admitting: Specialist

## 2019-09-26 ENCOUNTER — Other Ambulatory Visit: Payer: Self-pay

## 2019-09-26 ENCOUNTER — Ambulatory Visit: Payer: Medicare PPO | Admitting: Emergency Medicine

## 2019-09-26 ENCOUNTER — Encounter: Payer: Self-pay | Admitting: Emergency Medicine

## 2019-09-26 DIAGNOSIS — I2699 Other pulmonary embolism without acute cor pulmonale: Secondary | ICD-10-CM | POA: Diagnosis not present

## 2019-09-26 DIAGNOSIS — R0600 Dyspnea, unspecified: Secondary | ICD-10-CM | POA: Insufficient documentation

## 2019-09-26 DIAGNOSIS — R0602 Shortness of breath: Secondary | ICD-10-CM

## 2019-09-26 NOTE — Assessment & Plan Note (Addendum)
Which he describes mainly as decreased functional capacity and fatigability.  He has gained some weight due to immobility during his diagnosis and treatment for PE.  Suspect there is a component of deconditioning here.  He may need repeat pulmonary function testing, tells me that he had reassuring PFT with Dr. Merleen Milliner in [place] to work-up his symptoms.  I encouraged him to work on his exercise and conditioning.  Consider echocardiogram going forward to evaluate for possible secondary pulmonary hypertension.

## 2019-09-26 NOTE — Progress Notes (Signed)
Subjective:    Patient ID: Austin Savage, male    DOB: May 08, 1956, 64 y.o.   MRN: [id number]  HPI 64 year old former minimal smoker (2 pk/yrs) with a history of diabetes, hypertension, allergic rhinitis, lumbar DDD.   He has been experiencing fatigue with exertion, some subjective dyspnea. In September 2020 he had an episode that was severe enough that he had syncope or near-syncope when putting air in car tire. Happened again and he was admitted to [facility] and found to have B PE 04/2019. Started on Eliquis. His exertional tolerance is better - no more episodes of SOB. He had PFT in [place] w his PCP that were reported as normal. LE dopplers were negative. He has continued his ASA qd.   No clear precipitant for his PE, no travel, etc. No family hx of blood clots.    Review of Systems As above  Past Medical History:  Diagnosis Date   Arthritis    Diabetes mellitus without complication (HCC)    Neuropathy    Seasonal allergies    Wears glasses    reading     No family history on file.   Social History   Socioeconomic History   Marital status: Married    Spouse name: Austin Savage   Number of children: 3   Years of education: college   Highest education level: Not on file  Occupational History   Not on file  Tobacco Use   Smoking status: Former Smoker    Packs/day: 0.25    Years: 30.00    Pack years: 7.50    Types: Cigarettes    Quit date: 10/24/1982    Years since quitting: 36.9   Smokeless tobacco: Never Used  Substance and Sexual Activity   Alcohol use: No   Drug use: No   Sexual activity: Not on file  Other Topics Concern   Not on file  Social History Narrative   Not on file   Social Determinants of Health   Financial Resource Strain:    Difficulty of Paying Living Expenses: Not on file  Food Insecurity:    Worried About Austin Savage in the Last Year: Not on file   [employer] of Food in the Last Year: Not on file  Transportation Needs:     Lack of Transportation (Medical): Not on file   Lack of Transportation (Non-Medical): Not on file  Physical Activity:    Days of Exercise per Week: Not on file   Minutes of Exercise per Session: Not on file  Stress:    Feeling of Stress : Not on file  Social Connections:    Frequency of Communication with Friends and Family: Not on file   Frequency of Social Gatherings with Friends and Family: Not on file   Attends Religious Services: Not on file   Active Member of Clubs or Organizations: Not on file   Attends Banker Meetings: Not on file   Marital Status: Not on file  Intimate Partner Violence:    Fear of Current or Ex-Partner: Not on file   Emotionally Abused: Not on file   Physically Abused: Not on file   Sexually Abused: Not on file     No Known Allergies   Outpatient Medications Prior to Visit  Medication Sig Dispense Refill   amLODipine (NORVASC) 5 MG tablet Take 5 mg by mouth daily.     desonide (DESOWEN) 0.05 % cream      diclofenac Sodium (VOLTAREN) 1 % GEL APPLY 4  GRAMS OF GEL TOPICALLY FOUR TIMES DAILY 300 g 0   ELIQUIS 5 MG TABS tablet TAKE 1 TABLET BY MOUTH TWICE DAILY STARTING 09 23 2020 IN THE EVENING     ferrous sulfate 325 (65 FE) MG tablet Take 325 mg by mouth 2 (two) times daily with a meal.     fexofenadine (ALLEGRA) 180 MG tablet TAKE 1 TABLET BY MOUTH ONCE DAILY     gabapentin (NEURONTIN) 300 MG capsule Take 2 capsules (600 mg total) by mouth 3 (three) times daily. 540 capsule 3   glimepiride (AMARYL) 4 MG tablet Take 4 mg by mouth daily.     insulin aspart (NOVOLOG) 100 UNIT/ML FlexPen      insulin glargine (LANTUS) 100 UNIT/ML injection      lisinopril (ZESTRIL) 20 MG tablet Take 20 mg by mouth daily.     metFORMIN (GLUCOPHAGE) 500 MG tablet Take 500 mg by mouth daily.     simvastatin (ZOCOR) 20 MG tablet Take 20 mg by mouth daily.     No facility-administered medications prior to visit.          Objective:   Physical Exam Vitals:   09/26/19 1028  BP: 120/78  Pulse: 88  SpO2: 100%  Weight: 187 lb (84.8 kg)  Height: 5\' 8"  (1.727 m)   Gen: Pleasant, obese man, in no distress,  normal affect  ENT: No lesions,  mouth clear,  oropharynx clear, no postnasal drip  Neck: No JVD, no stridor  Lungs: No use of accessory muscles, no crackles or wheezing on normal respiration, no wheeze on forced expiration  Cardiovascular: RRR, heart sounds normal, no murmur or gallops, no peripheral edema  Musculoskeletal: No deformities, no cyanosis or clubbing  Neuro: alert, awake, non focal  Skin: Warm, no lesions or rash      Assessment & Plan:  Acute pulmonary embolism (HCC) Diagnosed with acute PE after syncope and profound fatigue in September 2020.  Has been on Eliquis ever since.  His functional capacity is improved although he still does not feel completely back to baseline.  This appears to be a nonprovoked clot.  He will need release genetic testing to gauge for risk of future clot but in this setting I suspect that the right decision will be continuing treatment dose Eliquis to complete 6 months and then transition to prevention dose Eliquis 2.5 mg twice daily.  He is agreeable with this plan and I do not see any contraindications.  Dyspnea Which he describes mainly as decreased functional capacity and fatigability.  He has gained some weight due to immobility during his diagnosis and treatment for PE.  Suspect there is a component of deconditioning here.  He may need repeat pulmonary function testing, tells me that he had reassuring PFT with Dr. Teryl Savage in [place] to work-up his symptoms.  I encouraged him to work on his exercise and conditioning.  Consider echocardiogram going forward to evaluate for possible secondary pulmonary hypertension.  Baltazar Apo, MD, PhD 09/26/2019, 12:27 PM [facility] [phone] or if no answer [phone]

## 2019-09-26 NOTE — Assessment & Plan Note (Signed)
Diagnosed with acute PE after syncope and profound fatigue in September 2020.  Has been on Eliquis ever since.  His functional capacity is improved although he still does not feel completely back to baseline.  This appears to be a nonprovoked clot.  He will need release genetic testing to gauge for risk of future clot but in this setting I suspect that the right decision will be continuing treatment dose Eliquis to complete 6 months and then transition to prevention dose Eliquis 2.5 mg twice daily.  He is agreeable with this plan and I do not see any contraindications.

## 2019-09-26 NOTE — Patient Instructions (Signed)
Please continue Eliquis 5 mg twice a day.  We will stay on this for at least 6 months total.  After that we will likely change the Eliquis to 2.5 mg twice a day to continue indefinitely We will get a copy of your pulmonary function testing that was done by Dr. Merleen Milliner in [place] to review Work on slowly and steadily increasing your exercise.  This will help with your breathing and fatigue We will consider repeating an echocardiogram in a few months We will perform blood work to assess your risk for future blood clots at your next visit Follow with Dr. Delton Coombes in 2 months or sooner if you have any problems.

## 2019-10-18 ENCOUNTER — Ambulatory Visit: Payer: Medicare PPO | Admitting: Specialist

## 2019-11-08 ENCOUNTER — Encounter: Payer: Self-pay | Admitting: Specialist

## 2019-11-08 ENCOUNTER — Ambulatory Visit (INDEPENDENT_AMBULATORY_CARE_PROVIDER_SITE_OTHER): Payer: Medicare PPO | Admitting: Specialist

## 2019-11-08 ENCOUNTER — Other Ambulatory Visit: Payer: Self-pay

## 2019-11-08 VITALS — BP 132/68 | HR 86 | Ht 68.0 in | Wt 180.0 lb

## 2019-11-08 DIAGNOSIS — M19042 Primary osteoarthritis, left hand: Secondary | ICD-10-CM | POA: Diagnosis not present

## 2019-11-08 DIAGNOSIS — M4807 Spinal stenosis, lumbosacral region: Secondary | ICD-10-CM

## 2019-11-08 DIAGNOSIS — M48062 Spinal stenosis, lumbar region with neurogenic claudication: Secondary | ICD-10-CM

## 2019-11-08 DIAGNOSIS — M4726 Other spondylosis with radiculopathy, lumbar region: Secondary | ICD-10-CM

## 2019-11-08 DIAGNOSIS — M7541 Impingement syndrome of right shoulder: Secondary | ICD-10-CM

## 2019-11-08 DIAGNOSIS — I739 Peripheral vascular disease, unspecified: Secondary | ICD-10-CM

## 2019-11-08 DIAGNOSIS — M7501 Adhesive capsulitis of right shoulder: Secondary | ICD-10-CM

## 2019-11-08 NOTE — Progress Notes (Addendum)
Office Visit Note   Patient: Austin Savage           Date of Birth: 23-Mar-1956           MRN: [id number] Visit Date: 11/08/2019              Requested by: Arlina Robes, MD [address] [place],  [place] [address] PCP: Arlina Robes, MD   Assessment & Plan: Visit Diagnoses:  1. Impingement syndrome of right shoulder   2. Primary osteoarthritis, left hand   3. Other spondylosis with radiculopathy, lumbar region   4. Spinal stenosis of lumbosacral region   5. Spinal stenosis of lumbar region with neurogenic claudication   6. Right leg claudication (HCC)   7. Adhesive capsulitis of right shoulder     Avoid frequent bending and stooping  No lifting greater than 10 lbs. May use ice or moist heat for pain. Weight loss is of benefit. Exercise is important to improve your indurance and does allow people to function better inspite of back pain.  Avoid overhead lifting and overhead use of the arms. Do not lift greater than 5 lbs. Adjust head rest in vehicle to prevent hyperextension if rear ended. Take extra precautions to avoid falling. Meloxicam for antiinflamatory affect Arthritis strength tylenol for pain up to 3-4 a day max.   Weight loss, NSIADs like diclofenac and exercise. Well padded shoes help.  Hemp CBD capsules, amazon.com 5,000-7,000 mg per bottle, 60 capsules per bottle, take one capsule twice a day. Cane in the left hand to use with left leg weight bearing. Follow-Up Instructions: No follow-ups on file.    Follow-Up Instructions: Return in about 3 months (around 02/08/2020).   Orders:  No orders of the defined types were placed in this encounter.  No orders of the defined types were placed in this encounter.     Procedures: No procedures performed   Clinical Data: No additional findings.   Subjective: Chief Complaint  Patient presents with  . Lower Back - Follow-up    64 year old male with history of cervical and lumbar  spondylosis and areas of moderate stenosis. Overall he has good and bad periods and is presently exercising with walking at the [place]. The lumbar spine is more painful than the neck and he is taking mobic and tylenol arthritis meds.  He continues on eloquis for history of pulmonary embolus and he also has diabetes and hypertension. No new complaints. He is not able to do yard work or heavy lifting.    Review of Systems  Constitutional: Positive for unexpected weight change. Negative for activity change, appetite change, chills, diaphoresis, fatigue and fever.  HENT: Negative.   Eyes: Negative.   Respiratory: Negative.   Cardiovascular: Negative.   Gastrointestinal: Negative.   Endocrine: Negative.   Genitourinary: Negative.   Musculoskeletal: Positive for back pain, gait problem, neck pain and neck stiffness.  Skin: Negative.   Allergic/Immunologic: Negative.   Hematological: Negative.   Psychiatric/Behavioral: Negative.      Objective: Vital Signs: BP 132/68 (BP Location: Left Arm, Patient Position: Sitting)   Pulse 86   Ht 5\' 8"  (1.727 m)   Wt 180 lb (81.6 kg)   BMI 27.37 kg/m   Physical Exam  Ortho Exam  Specialty Comments:  No specialty comments available.  Imaging: No results found.   PMFS History: Patient Active Problem List   Diagnosis Date Noted  . Carpal tunnel syndrome of right wrist 10/29/2013    Priority: High  Class: Chronic  . De Quervain's tenosynovitis, right 10/29/2013    Priority: High    Class: Chronic  . Acute pulmonary embolism ([place]) 09/26/2019  . Dyspnea 09/26/2019   Past Medical History:  Diagnosis Date  . Arthritis   . Diabetes mellitus without complication ([place])   . Neuropathy   . Seasonal allergies   . Wears glasses    reading    History reviewed. No pertinent family history.  Past Surgical History:  Procedure Laterality Date  . CARPAL TUNNEL RELEASE Right 10/29/2013   Procedure: RIGHT OPEN CARPAL TUNNEL RELEASE;  Surgeon:  Jessy Oto, MD;  Location: [facility];  Service: Orthopedics;  Laterality: Right;  . DORSAL COMPARTMENT RELEASE Right 10/29/2013   Procedure: RIGHT DORSAL COMPARTMENT RELEASE (DEQUERVAIN);  Surgeon: Jessy Oto, MD;  Location: [facility];  Service: Orthopedics;  Laterality: Right;  . 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 Not on file  Tobacco Use  . Smoking status: Former Smoker    Packs/day: 0.25    Years: 30.00    Pack years: 7.50    Types: Cigarettes    Quit date: 10/24/1982    Years since quitting: 37.0  . Smokeless tobacco: Never Used  Substance and Sexual Activity  . Alcohol use: No  . Drug use: No  . Sexual activity: Not on file

## 2019-11-08 NOTE — Patient Instructions (Addendum)
Avoid frequent bending and stooping  No lifting greater than 10 lbs. May use ice or moist heat for pain. Weight loss is of benefit. Exercise is important to improve your indurance and does allow people to function better inspite of back pain.  Avoid overhead lifting and overhead use of the arms. Do not lift greater than 5 lbs. Adjust head rest in vehicle to prevent hyperextension if rear ended. Take extra precautions to avoid falling. Meloxicam for antiinflamatory affect Arthritis strength tylenol for pain up to 3-4 a day max.   Weight loss, NSIADs like diclofenac and exercise. Well padded shoes help.  Hemp CBD capsules, amazon.com 5,000-7,000 mg per bottle, 60 capsules per bottle, take one capsule twice a day. Cane in the left hand to use with left leg weight bearing. Follow-Up Instructions: No follow-ups on file.

## 2019-11-27 ENCOUNTER — Encounter: Payer: Self-pay | Admitting: Emergency Medicine

## 2019-11-27 ENCOUNTER — Telehealth: Payer: Self-pay | Admitting: Emergency Medicine

## 2019-11-27 ENCOUNTER — Ambulatory Visit: Payer: Medicare PPO | Admitting: Emergency Medicine

## 2019-11-27 ENCOUNTER — Other Ambulatory Visit: Payer: Self-pay

## 2019-11-27 VITALS — BP 128/68 | HR 82 | Temp 97.6°F | Ht 68.0 in | Wt 190.8 lb

## 2019-11-27 DIAGNOSIS — R0602 Shortness of breath: Secondary | ICD-10-CM | POA: Diagnosis not present

## 2019-11-27 DIAGNOSIS — I2699 Other pulmonary embolism without acute cor pulmonale: Secondary | ICD-10-CM

## 2019-11-27 DIAGNOSIS — J309 Allergic rhinitis, unspecified: Secondary | ICD-10-CM | POA: Insufficient documentation

## 2019-11-27 DIAGNOSIS — J301 Allergic rhinitis due to pollen: Secondary | ICD-10-CM | POA: Diagnosis not present

## 2019-11-27 NOTE — Assessment & Plan Note (Signed)
May be multifactorial.  He describes some nasal obstruction, also consider deconditioning.  Reportedly had pulmonary function testing in [place] that was reassuring.  I believe we need to repeat these, repeat his echocardiogram.  Check a chest x-ray on his next visit.  Evaluation and treatment for thromboembolic disease as above.

## 2019-11-27 NOTE — Assessment & Plan Note (Signed)
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  Some of his dyspnea does sound like there is a component of nasal obstruction.  He is off Allegra, did not feel that he benefited.  Will try starting fluticasone nasal spray.

## 2019-11-27 NOTE — Telephone Encounter (Signed)
Spoke with patient let him know he could go to [facility] to have labs drawn. All lab orders have been placed. Patient aware to go in 2 weeks , around April 19-21 at [facility], patient verbalized understanding. Nothing further needed at this time.

## 2019-11-27 NOTE — Progress Notes (Signed)
Subjective:    Patient ID: Austin Savage, male    DOB: 1955/11/23, 64 y.o.   MRN: [id number]  HPI 64 year old former minimal smoker (2 pk/yrs) with a history of diabetes, hypertension, allergic rhinitis, lumbar DDD.   He has been experiencing fatigue with exertion, some subjective dyspnea. In September 2020 he had an episode that was severe enough that he had syncope or near-syncope when putting air in car tire. Happened again and he was admitted to [facility] and found to have B PE 04/2019. Started on Eliquis. His exertional tolerance is better - no more episodes of SOB. He had PFT in [place] w his PCP that were reported as normal. LE dopplers were negative. He has continued his ASA qd.   No clear precipitant for his PE, no travel, etc. No family hx of blood clots.   ROV 11/27/19 --64 year old gentleman who follows up today after diagnosis with bilateral pulmonary emboli in 04/2019.  He was treated with Eliquis with improvement in dyspnea and exercise tolerance.  He is also been on aspirin.  As above lower extremity Doppler studies and pulmonary function testing in [place] were reassuring around the time of his original diagnosis. He is having fatigue, exertional SOB. He hears noise that sounds nasal, or UA. He has nasal allergies - no longer on immunotherapy, has not benefited much from anti-histamines.    Review of Systems As above  Past Medical History:  Diagnosis Date  . Arthritis   . Diabetes mellitus without complication ([place])   . Neuropathy   . Seasonal allergies   . Wears glasses    reading     History reviewed. No pertinent family history.   Social History   Socioeconomic History  . Marital status: Married    Spouse name: Sherren Mocha  . Number of children: 3  . Years of education: college  . Highest education level: Not on file  Occupational History  . Not on file  Tobacco Use  . Smoking status: Former Smoker    Packs/day: 0.25    Years: 30.00    Pack years: 7.50   Types: Cigarettes    Quit date: 10/24/1982    Years since quitting: 37.1  . Smokeless tobacco: Never Used  Substance and Sexual Activity  . Alcohol use: No  . Drug use: No  . Sexual activity: Not on file  Other Topics Concern  . Not on file  Social History Narrative  . Not on file   Social Determinants of Health   Financial Resource Strain:   . Difficulty of Paying Living Expenses:   Food Insecurity:   . Worried About Charity fundraiser in the Last Year:   . Arboriculturist in the Last Year:   Transportation Needs:   . Film/video editor (Medical):   Marland Kitchen Lack of Transportation (Non-Medical):   Physical Activity:   . Days of Exercise per Week:   . Minutes of Exercise per Session:   Stress:   . Feeling of Stress :   Social Connections:   . Frequency of Communication with Friends and Family:   . Frequency of Social Gatherings with Friends and Family:   . Attends Religious Services:   . Active Member of Clubs or Organizations:   . Attends Archivist Meetings:   Marland Kitchen Marital Status:   Intimate Partner Violence:   . Fear of Current or Ex-Partner:   . Emotionally Abused:   Marland Kitchen Physically Abused:   . Sexually Abused:  No Known Allergies   Outpatient Medications Prior to Visit  Medication Sig Dispense Refill  . amLODipine (NORVASC) 5 MG tablet Take 5 mg by mouth daily.    Marland Kitchen desonide (DESOWEN) 0.05 % cream     . diclofenac Sodium (VOLTAREN) 1 % GEL APPLY 4 GRAMS OF GEL TOPICALLY FOUR TIMES DAILY 300 g 0  . ELIQUIS 5 MG TABS tablet TAKE 1 TABLET BY MOUTH TWICE DAILY STARTING 09 23 2020 IN THE EVENING    . ferrous sulfate 325 (65 FE) MG tablet Take 325 mg by mouth 2 (two) times daily with a meal.    . gabapentin (NEURONTIN) 300 MG capsule Take 2 capsules (600 mg total) by mouth 3 (three) times daily. 540 capsule 3  . glimepiride (AMARYL) 4 MG tablet Take 4 mg by mouth daily.    . insulin aspart (NOVOLOG) 100 UNIT/ML FlexPen     . insulin glargine (LANTUS) 100  UNIT/ML injection     . lisinopril (ZESTRIL) 20 MG tablet Take 20 mg by mouth daily.    . metFORMIN (GLUCOPHAGE) 500 MG tablet Take 500 mg by mouth daily.    . simvastatin (ZOCOR) 20 MG tablet Take 20 mg by mouth daily.    . fexofenadine (ALLEGRA) 180 MG tablet TAKE 1 TABLET BY MOUTH ONCE DAILY     No facility-administered medications prior to visit.        Objective:   Physical Exam Vitals:   11/27/19 0948  BP: 128/68  Pulse: 82  Temp: 97.6 F (36.4 C)  TempSrc: Temporal  SpO2: 100%  Weight: 190 lb 12.8 oz (86.5 kg)  Height: 5\' 8"  (1.727 m)   Gen: Pleasant, obese man, in no distress,  normal affect  ENT: No lesions,  mouth clear,  oropharynx clear, no postnasal drip  Neck: No JVD, no stridor  Lungs: No use of accessory muscles, no crackles or wheezing on normal respiration, no wheeze on forced expiration  Cardiovascular: RRR, heart sounds normal, no murmur or gallops, no peripheral edema  Musculoskeletal: No deformities, no cyanosis or clubbing  Neuro: alert, awake, non focal  Skin: Warm, no lesions or rash      Assessment & Plan:  Acute pulmonary embolism (HCC) Still with persistent dyspnea.  He had an echocardiogram in [place] at time of his original diagnosis.  He is at risk for pulmonary hypertension.  We will repeat his echocardiogram.  We will stop his Eliquis for now after 6 and half months of therapy, plan for a hypercoagulability panel to help gauge dosing going forward.  I suspect we will be able to decrease him to 2.5 mg twice daily.  He will obtain the labs in [place].  Follow-up with me to review lab results and decide about his anticoagulation.  Dyspnea May be multifactorial.  He describes some nasal obstruction, also consider deconditioning.  Reportedly had pulmonary function testing in [place] that was reassuring.  I believe we need to repeat these, repeat his echocardiogram.  Check a chest x-ray on his next visit.  Evaluation and treatment for  thromboembolic disease as above.  Allergic rhinitis [place] on immunotherapy, not currently.  Some of his dyspnea does sound like there is a component of nasal obstruction.  He is off Allegra, did not feel that he benefited.  Will try starting fluticasone nasal spray.  [place], MD, PhD 11/27/2019, 10:12 AM [facility] [phone] or if no answer [phone]

## 2019-11-27 NOTE — Patient Instructions (Addendum)
Okay to stop Eliquis for now. We will check lab work in 2 weeks to gauge your risk for developing a future blood clot. Depending on the lab work we will decide which dose of Eliquis to use long-term going forward. We will arrange for repeat echocardiogram to compare with the one you had in [place] when you are pulmonary emboli were diagnosed. Please try starting fluticasone nasal spray, 2 sprays each nostril once daily. Follow with Dr Delton Coombes next available with full PFT and CXR on the same day.

## 2019-11-27 NOTE — Assessment & Plan Note (Addendum)
Still with persistent dyspnea.  He had an echocardiogram in [place] at time of his original diagnosis.  He is at risk for pulmonary hypertension.  We will repeat his echocardiogram.  We will stop his Eliquis for now after 6 and half months of therapy, plan for a hypercoagulability panel to help gauge dosing going forward.  I suspect we will be able to decrease him to 2.5 mg twice daily.  He will obtain the labs in [place].  Follow-up with me to review lab results and decide about his anticoagulation.

## 2019-11-28 ENCOUNTER — Telehealth: Payer: Self-pay | Admitting: Emergency Medicine

## 2019-11-28 MED ORDER — FLUTICASONE PROPIONATE 50 MCG/ACT NA SUSP: 2.0000 | Freq: Every day | NASAL | 2 refills | Status: DC

## 2019-11-28 NOTE — Telephone Encounter (Signed)
Spoke with patient. He stated that RB advised him that he would call in Flonase nasal spray for him after his visit yesterday. Medication was never called in. I apologized to the patient and offered to call it in to [facility] for him. He is aware of the instructions. Nothing further needed at time of call.

## 2019-11-29 ENCOUNTER — Ambulatory Visit: Payer: Medicare PPO

## 2019-11-29 ENCOUNTER — Encounter (HOSPITAL_COMMUNITY): Payer: Medicare PPO

## 2019-11-29 ENCOUNTER — Other Ambulatory Visit: Payer: Self-pay | Admitting: *Deleted

## 2019-11-29 ENCOUNTER — Ambulatory Visit (HOSPITAL_COMMUNITY)
Admission: RE | Admit: 2019-11-29 | Discharge: 2019-11-29 | Disposition: A | Discharge status: Home / Self Care | Payer: Medicare PPO | Source: Ambulatory Visit | Attending: Emergency Medicine | Admitting: Emergency Medicine

## 2019-11-29 ENCOUNTER — Other Ambulatory Visit: Payer: Self-pay

## 2019-11-29 DIAGNOSIS — I517 Cardiomegaly: Secondary | ICD-10-CM | POA: Diagnosis not present

## 2019-11-29 DIAGNOSIS — I2699 Other pulmonary embolism without acute cor pulmonale: Secondary | ICD-10-CM | POA: Insufficient documentation

## 2019-11-29 DIAGNOSIS — E119 Type 2 diabetes mellitus without complications: Secondary | ICD-10-CM | POA: Diagnosis not present

## 2019-11-29 DIAGNOSIS — I739 Peripheral vascular disease, unspecified: Secondary | ICD-10-CM

## 2019-11-29 NOTE — Progress Notes (Signed)
*  PRELIMINARY RESULTS* Echocardiogram 2D Echocardiogram has been performed.  Stacey Drain 11/29/2019, 12:32 PM

## 2019-12-05 ENCOUNTER — Telehealth (HOSPITAL_COMMUNITY): Payer: Self-pay

## 2019-12-05 NOTE — Telephone Encounter (Signed)

## 2019-12-06 ENCOUNTER — Ambulatory Visit (HOSPITAL_COMMUNITY)
Admission: RE | Admit: 2019-12-06 | Discharge: 2019-12-06 | Disposition: A | Discharge status: Home / Self Care | Payer: Medicare PPO | Source: Ambulatory Visit | Attending: Vascular Surgery | Admitting: Vascular Surgery

## 2019-12-06 ENCOUNTER — Ambulatory Visit (INDEPENDENT_AMBULATORY_CARE_PROVIDER_SITE_OTHER): Payer: Medicare PPO | Admitting: Physician Assistant

## 2019-12-06 ENCOUNTER — Other Ambulatory Visit: Payer: Self-pay

## 2019-12-06 VITALS — BP 179/81 | HR 82 | Temp 97.4°F | Resp 14 | Ht 68.0 in | Wt 191.8 lb

## 2019-12-06 DIAGNOSIS — E1151 Type 2 diabetes mellitus with diabetic peripheral angiopathy without gangrene: Secondary | ICD-10-CM | POA: Diagnosis not present

## 2019-12-06 DIAGNOSIS — I739 Peripheral vascular disease, unspecified: Secondary | ICD-10-CM | POA: Diagnosis present

## 2019-12-06 NOTE — Progress Notes (Signed)
Office Note     CC:  follow up Requesting Provider:  Andres Shad, *  HPI: Austin Savage is a 64 y.o. ([birth year]) male who presents for six month follow-up of peripheral arterial disease manifested by decreased right ankle-brachial index.  He was initially evaluated 2 years ago with psuedoclaudication as well as a component of PAD.  He was seen 6 months ago with non-invasive studies and non-compressible right ABI.  He is currently very active including yard work and states he maintains his yard and uses a Scientist, research (life sciences) on two large areas. Denies lower extremity pain with this work. Denies rest pain. The patient was diagnosed with pulmonary embolism in September 2020 and was placed on Eliquis. He is currently holding his Eliquis for 2 weeks for work-up of hypercoagulable state.  The pt is on a statin for cholesterol management.  The pt is not on a daily aspirin.   Other AC:  apixaban The pt is on ACEI, CCB for hypertension.   The pt is diabetic.  Insulin and oral meds Tobacco hx:  Former cigs; quit 1984  Past Medical History:  Diagnosis Date  . Arthritis   . Diabetes mellitus without complication ([place])   . Neuropathy   . Seasonal allergies   . Wears glasses    reading    Past Surgical History:  Procedure Laterality Date  . CARPAL TUNNEL RELEASE Right 10/29/2013   Procedure: RIGHT OPEN CARPAL TUNNEL RELEASE;  Surgeon: Jessy Oto, MD;  Location: [place];  Service: Orthopedics;  Laterality: Right;  . DORSAL COMPARTMENT RELEASE Right 10/29/2013   Procedure: RIGHT DORSAL COMPARTMENT RELEASE (DEQUERVAIN);  Surgeon: Jessy Oto, MD;  Location: [place];  Service: Orthopedics;  Laterality: Right;  . FINGER AMPUTATION  2000   accidental middle right finger    Social History   Socioeconomic History  . Marital status: Married    Spouse name: Sherren Mocha  . Number of children: 3  . Years of education: college  . Highest education level: Not on  file  Occupational History  . Not on file  Tobacco Use  . Smoking status: Former Smoker    Packs/day: 0.25    Years: 30.00    Pack years: 7.50    Types: Cigarettes    Quit date: 10/24/1982    Years since quitting: 37.1  . Smokeless tobacco: Never Used  Substance and Sexual Activity  . Alcohol use: No  . Drug use: No  . Sexual activity: Not on file  Other Topics Concern  . Not on file  Social History Narrative  . Not on file   Social Determinants of Health   Financial Resource Strain:   . Difficulty of Paying Living Expenses:   Food Insecurity:   . Worried About Charity fundraiser in the Last Year:   . Arboriculturist in the Last Year:   Transportation Needs:   . Film/video editor (Medical):   Marland Kitchen Lack of Transportation (Non-Medical):   Physical Activity:   . Days of Exercise per Week:   . Minutes of Exercise per Session:   Stress:   . Feeling of Stress :   Social Connections:   . Frequency of Communication with Friends and Family:   . Frequency of Social Gatherings with Friends and Family:   . Attends Religious Services:   . Active Member of Clubs or Organizations:   . Attends Archivist Meetings:   Marland Kitchen Marital Status:  Intimate Partner Violence:   . Fear of Current or Ex-Partner:   . Emotionally Abused:   Marland Kitchen Physically Abused:   . Sexually Abused:    No family history on file.  Current Outpatient Medications  Medication Sig Dispense Refill  . amLODipine (NORVASC) 5 MG tablet Take 5 mg by mouth daily.    Marland Kitchen desonide (DESOWEN) 0.05 % cream     . diclofenac Sodium (VOLTAREN) 1 % GEL APPLY 4 GRAMS OF GEL TOPICALLY FOUR TIMES DAILY 300 g 0  . ELIQUIS 5 MG TABS tablet TAKE 1 TABLET BY MOUTH TWICE DAILY STARTING 09 23 2020 IN THE EVENING    . ferrous sulfate 325 (65 FE) MG tablet Take 325 mg by mouth 2 (two) times daily with a meal.    . fluticasone (FLONASE) 50 MCG/ACT nasal spray Place 2 sprays into both nostrils daily. 16 g 2  . gabapentin (NEURONTIN)  300 MG capsule Take 2 capsules (600 mg total) by mouth 3 (three) times daily. 540 capsule 3  . glimepiride (AMARYL) 4 MG tablet Take 4 mg by mouth daily.    . insulin aspart (NOVOLOG) 100 UNIT/ML FlexPen     . insulin glargine (LANTUS) 100 UNIT/ML injection     . lisinopril (ZESTRIL) 20 MG tablet Take 20 mg by mouth daily.    . metFORMIN (GLUCOPHAGE) 500 MG tablet Take 500 mg by mouth daily.    . simvastatin (ZOCOR) 20 MG tablet Take 20 mg by mouth daily.     No current facility-administered medications for this visit.    No Known Allergies   REVIEW OF SYSTEMS:   [X]  denotes positive finding, [ ]  denotes negative finding Cardiac  Comments:  Chest pain or chest pressure:    Shortness of breath upon exertion:    Short of breath when lying flat:    Irregular heart rhythm:        Vascular    Pain in calf, thigh, or hip brought on by ambulation:    Pain in feet at night that wakes you up from your sleep:     Blood clot in your veins:    Leg swelling:         Pulmonary    Oxygen at home:    Productive cough:     Wheezing:         Neurologic    Sudden weakness in arms or legs:     Sudden numbness in arms or legs:     Sudden onset of difficulty speaking or slurred speech:    Temporary loss of vision in one eye:     Problems with dizziness:         Gastrointestinal    Blood in stool:     Vomited blood:         Genitourinary    Burning when urinating:     Blood in urine:        Psychiatric    Major depression:         Hematologic    Bleeding problems:    Problems with blood clotting too easily:        Skin    Rashes or ulcers:        Constitutional    Fever or chills:      PHYSICAL EXAMINATION:  Vitals:   12/06/19 1107  Weight: 191 lb 12.8 oz (87 kg)  Height: 5\' 8"  (1.727 m)   General:  WDWN in NAD; vital signs documented above Gait: Not observed  HENT: WNL, normocephalic Pulmonary: normal non-labored breathing , without Rales, rhonchi,   wheezing Cardiac: regular HR, without  Murmurs without carotid bruits Abdomen: soft, NT, no masses Extremities: without ischemic changes, without Gangrene , without cellulitis; without open wounds; he has multiple, small areas (approximately 1 cm ) on both lower legs and feet of various stages of healing. These appear to be insect bites, however he states he bumped into a piece of equipment in his garage and sustained small skin scrapes. Both feet are warm with intact sensation. Normal motor function. Musculoskeletal: no muscle wasting or atrophy  Neurologic: A&O X 3;  No focal weakness or paresthesias are detected Psychiatric:  The pt has Normal affect.  Pulse exam: 2+ right brachial and ulnar pulses. 1+ left brachial, ulnar and bilateral femoral artery pulses  Non-Invasive Vascular Imaging:   12/06/2019: ABI/TBIToday's ABIToday's TBIPrevious ABIPrevious TBI  +-------+-----------+-----------+------------+------------+  Right [place]     0.31    [place]     0.42      +-------+-----------+-----------+------------+------------+  Left  0.84    0.42    0.92    0.55        Prior ABIs: October, 2020  ABI/TBI Today's ABI Today's TBI Previous ABI Previous TBI Right [place] 0.42 0.79 0.53 Left 0.92 0.55 0.86 0.41 Monophasic waveforms bilaterally  ASSESSMENT/PLAN:: 64 y.o. male here for follow up for peripheral arterial disease. No claudication or rest pain. His ABIs are fairly stable as compared to 6 months ago. I discussed large and small vessel disease and diabetes as a contributing factor. I explained he would need to be vigilant in monitoring the health of the skin of his feet and ankles. I encouraged him to seek immediate attention for any nonhealing skin wound.  Continue regular exercise, glucose control and continue compliance with his medications. He has been able to maintain smoking cessation his adult life. We'll plan to see him in 1 year or sooner for  concerning symptoms.    Milinda Antis, PA-C [facility] [phone]  Clinic MD:  Darrick Penna

## 2019-12-07 ENCOUNTER — Other Ambulatory Visit: Payer: Self-pay | Admitting: *Deleted

## 2019-12-07 DIAGNOSIS — I739 Peripheral vascular disease, unspecified: Secondary | ICD-10-CM

## 2019-12-14 ENCOUNTER — Other Ambulatory Visit: Payer: Self-pay

## 2019-12-14 ENCOUNTER — Other Ambulatory Visit (HOSPITAL_COMMUNITY)
Admission: RE | Admit: 2019-12-14 | Discharge: 2019-12-14 | Disposition: A | Discharge status: Home / Self Care | Payer: Medicare PPO | Source: Ambulatory Visit | Attending: Emergency Medicine | Admitting: Emergency Medicine

## 2019-12-14 ENCOUNTER — Other Ambulatory Visit (HOSPITAL_COMMUNITY): Admission: RE | Admit: 2019-12-14 | Payer: Medicare PPO | Source: Ambulatory Visit

## 2019-12-14 DIAGNOSIS — J301 Allergic rhinitis due to pollen: Secondary | ICD-10-CM

## 2019-12-14 DIAGNOSIS — Z01812 Encounter for preprocedural laboratory examination: Secondary | ICD-10-CM | POA: Diagnosis present

## 2019-12-14 DIAGNOSIS — Z20822 Contact with and (suspected) exposure to covid-19: Secondary | ICD-10-CM | POA: Diagnosis not present

## 2019-12-14 DIAGNOSIS — I2699 Other pulmonary embolism without acute cor pulmonale: Secondary | ICD-10-CM | POA: Diagnosis not present

## 2019-12-14 LAB — ANTITHROMBIN III: AntiThromb III Func: 109 % (ref 75–120)

## 2019-12-15 LAB — LUPUS ANTICOAGULANT PANEL
DRVVT: 43 s (ref 0.0–47.0)
PTT Lupus Anticoagulant: 30.4 s (ref 0.0–51.9)

## 2019-12-15 LAB — SARS CORONAVIRUS 2 (TAT 6-24 HRS): SARS Coronavirus 2: NEGATIVE

## 2019-12-15 LAB — HOMOCYSTEINE: Homocysteine: 13.5 umol/L (ref 0.0–17.2)

## 2019-12-15 LAB — BETA-2-GLYCOPROTEIN I ABS, IGG/M/A
Beta-2 Glyco I IgG: <9 GPI IgG units (ref 0–20)
Beta-2-Glycoprotein I IgA: 10 GPI IgA units (ref 0–25)
Beta-2-Glycoprotein I IgM: <9 GPI IgM units (ref 0–32)

## 2019-12-15 LAB — PROTEIN S, TOTAL: Protein S Ag, Total: 102 % (ref 60–150)

## 2019-12-15 LAB — CARDIOLIPIN ANTIBODIES, IGG, IGM, IGA
Anticardiolipin IgA: <9 APL U/mL (ref 0–11)
Anticardiolipin IgG: <9 GPL U/mL (ref 0–14)
Anticardiolipin IgM: <9 MPL U/mL (ref 0–12)

## 2019-12-15 LAB — PROTEIN C ACTIVITY: Protein C Activity: 96 % (ref 73–180)

## 2019-12-15 LAB — PROTEIN S ACTIVITY: Protein S Activity: 121 % (ref 63–140)

## 2019-12-17 LAB — PROTEIN C, TOTAL: Protein C, Total: 92 % (ref 60–150)

## 2019-12-19 ENCOUNTER — Ambulatory Visit: Payer: Medicare PPO | Admitting: Emergency Medicine

## 2019-12-19 ENCOUNTER — Other Ambulatory Visit: Payer: Self-pay

## 2019-12-19 ENCOUNTER — Encounter: Payer: Self-pay | Admitting: Emergency Medicine

## 2019-12-19 ENCOUNTER — Ambulatory Visit (INDEPENDENT_AMBULATORY_CARE_PROVIDER_SITE_OTHER): Payer: Medicare PPO | Admitting: Emergency Medicine

## 2019-12-19 DIAGNOSIS — R0602 Shortness of breath: Secondary | ICD-10-CM

## 2019-12-19 DIAGNOSIS — I2699 Other pulmonary embolism without acute cor pulmonale: Secondary | ICD-10-CM

## 2019-12-19 DIAGNOSIS — J301 Allergic rhinitis due to pollen: Secondary | ICD-10-CM

## 2019-12-19 LAB — PULMONARY FUNCTION TEST
DL/VA % pred: 130 %
DL/VA: 5.47 ml/min/mmHg/L
DLCO cor % pred: 104 %
DLCO cor: 26.93 ml/min/mmHg
DLCO unc % pred: 104 %
DLCO unc: 26.93 ml/min/mmHg
FEF 25-75 Post: 2.19 L/sec
FEF 25-75 Pre: 1.73 L/sec
FEF2575-%Change-Post: 27 %
FEF2575-%Pred-Post: 82 %
FEF2575-%Pred-Pre: 65 %
FEV1-%Change-Post: 5 %
FEV1-%Pred-Post: 70 %
FEV1-%Pred-Pre: 66 %
FEV1-Post: 2.03 L
FEV1-Pre: 1.92 L
FEV1FVC-%Change-Post: 2 %
FEV1FVC-%Pred-Pre: 101 %
FEV6-%Change-Post: 3 %
FEV6-%Pred-Post: 69 %
FEV6-%Pred-Pre: 67 %
FEV6-Post: 2.51 L
FEV6-Pre: 2.42 L
FEV6FVC-%Pred-Post: 104 %
FEV6FVC-%Pred-Pre: 104 %
FVC-%Change-Post: 2 %
FVC-%Pred-Post: 67 %
FVC-%Pred-Pre: 65 %
FVC-Post: 2.51 L
FVC-Pre: 2.44 L
Post FEV1/FVC ratio: 81 %
Post FEV6/FVC ratio: 100 %
Pre FEV1/FVC ratio: 79 %
Pre FEV6/FVC Ratio: 100 %
RV % pred: 135 %
RV: 3.02 L
TLC % pred: 132 %
TLC: 8.9 L

## 2019-12-19 LAB — FACTOR 5 LEIDEN

## 2019-12-19 MED ORDER — SPIRIVA RESPIMAT 2.5 MCG/ACT IN AERS: 2.0000 | INHALATION_SPRAY | Freq: Every day | RESPIRATORY_TRACT | 0 refills | Status: DC

## 2019-12-19 MED ORDER — SPIRIVA RESPIMAT 2.5 MCG/ACT IN AERS: 2.0000 | INHALATION_SPRAY | Freq: Every day | RESPIRATORY_TRACT | 5 refills | Status: DC

## 2019-12-19 MED ORDER — APIXABAN 2.5 MG PO TABS: 2.5000 mg | ORAL_TABLET | Freq: Two times a day (BID) | ORAL | 5 refills | Status: DC

## 2019-12-19 NOTE — Assessment & Plan Note (Signed)
Benefited significantly from the addition of fluticasone nasal spray, plan to continue.

## 2019-12-19 NOTE — Assessment & Plan Note (Signed)
His pulmonary function testing reveals mixed obstruction and restriction.  We will do a trial of bronchodilator to see if he benefits.  Start Spiriva Respimat today.  He will call us to let us know if it helps his breathing.  If so we will continue it going forward.  I did warn him about potential side effects including dry mouth, urinary retention.

## 2019-12-19 NOTE — Addendum Note (Signed)
Addended by: Vianne Bulls R on: 12/19/2019 11:40 AM   Modules accepted: Orders

## 2019-12-19 NOTE — Progress Notes (Signed)
Patient had difficulty following direction. Austin Savage pre and post and Pleth performed today. Patient was unable perform DLCO despite numerous attempts.

## 2019-12-19 NOTE — Assessment & Plan Note (Signed)
Unprovoked clot.  His hypercoagulability panel is all negative (factor II mutation is still pending).  I have recommended that he go back on Eliquis 2.5 mg twice a day for prevention dosing as long as he can tolerate, does not have any complications.  He is agreeable.  We will start this today.

## 2019-12-19 NOTE — Patient Instructions (Addendum)
We will restart Eliquis at lower dose,  2.5 mg twice a day. Try starting Spiriva Respimat 2 sprays once daily.  Keep track of with you benefit from this medication, whether it helps your breathing.  If so then call our office and we will send a prescription to your pharmacy so that we can continue it. Continue fluticasone nasal spray 2 sprays each nostril once daily. Follow with Dr Delton Coombes in 6 months or sooner if you have any problems

## 2019-12-19 NOTE — Progress Notes (Signed)
Subjective:    Patient ID: Austin Savage, male    DOB: June 29, 1956, 64 y.o.   MRN: [id number]  HPI 64 year old former minimal smoker (2 pk/yrs) with a history of diabetes, hypertension, allergic rhinitis, lumbar DDD.   He has been experiencing fatigue with exertion, some subjective dyspnea. In September 2020 he had an episode that was severe enough that he had syncope or near-syncope when putting air in car tire. Happened again and he was admitted to [facility] and found to have B PE 04/2019. Started on Eliquis. His exertional tolerance is better - no more episodes of SOB. He had PFT in [place] w his PCP that were reported as normal. LE dopplers were negative. He has continued his ASA qd.   No clear precipitant for his PE, no travel, etc. No family hx of blood clots.   ROV 11/27/19 --64 year old gentleman who follows up today after diagnosis with bilateral pulmonary emboli in 04/2019.  He was treated with Eliquis with improvement in dyspnea and exercise tolerance.  He is also been on aspirin.  As above lower extremity Doppler studies and pulmonary function testing in [place] were reassuring around the time of his original diagnosis. He is having fatigue, exertional SOB. He hears noise that sounds nasal, or UA. He has nasal allergies - no longer on immunotherapy, has not benefited much from anti-histamines.   ROV 12/19/19 --this is a follow-up visit for 64 year old gentleman with a history of bilateral pulmonary emboli 04/2019 which was treated with Eliquis.  He has associated dyspnea, possibly other contributors including nasal obstruction from allergic rhinitis.  We stopped his Eliquis last visit, performed a hyper coagulability panel as below.  Tried starting fluticasone nasal spray last visit, reports that this has helped him a lot - better air movement through his nose PFT done today, reviewed by me, show mixed obstruction and restriction, FEV1 66 to 70% predicted.  Normal volumes, normal diffusion  capacity. Echocardiogram was done on 11/29/2019 and I have reviewed the results, shows intact LV function 60-65%, normal RV function and size without any TR.  PASP not estimated He reports today that he is doing well, does still have some exertional fatigue.   12/14/2019 >>  Antithrombin III activity normal, beta 2 glycoprotein antibodies negative, homocystine normal, protein C amount and activity normal, protein S amount and activity normal, factor V Leiden mutation negative, anticardiolipin antibodies negative.  Lupus anticoagulant negative.  His prothrombin gene evaluation is still pending.   Review of Systems As above      Objective:   Physical Exam Vitals:   12/19/19 1118  BP: 134/62  Pulse: 96  Temp: 98.2 F (36.8 C)  TempSrc: Temporal  SpO2: 98%  Weight: 190 lb (86.2 kg)  Height: 5' 8.5" (1.74 m)   Gen: Pleasant, obese man, in no distress,  normal affect  ENT: No lesions,  mouth clear,  oropharynx clear, no postnasal drip  Neck: No JVD, no stridor  Lungs: No use of accessory muscles, no crackles or wheezing on normal respiration, no wheeze on forced expiration  Cardiovascular: RRR, heart sounds normal, no murmur or gallops, no peripheral edema  Musculoskeletal: No deformities, no cyanosis or clubbing  Neuro: alert, awake, non focal  Skin: Warm, no lesions or rash      Assessment & Plan:  Allergic rhinitis Benefited significantly from the addition of fluticasone nasal spray, plan to continue.  Acute pulmonary embolism (HCC) Unprovoked clot.  His hypercoagulability panel is all negative (factor II mutation is still pending).  I have recommended that he go back on Eliquis 2.5 mg twice a day for prevention dosing as long as he can tolerate, does not have any complications.  He is agreeable.  We will start this today.  Dyspnea His pulmonary function testing reveals mixed obstruction and restriction.  We will do a trial of bronchodilator to see if he benefits.  Start  Spiriva Respimat today.  He will call us to let us know if it helps his breathing.  If so we will continue it going forward.  I did warn him about potential side effects including dry mouth, urinary retention.  Baltazar Apo, MD, PhD 12/19/2019, 11:33 AM [facility] [phone] or if no answer [phone]

## 2019-12-20 LAB — PROTHROMBIN GENE MUTATION

## 2020-03-03 ENCOUNTER — Other Ambulatory Visit: Payer: Self-pay | Admitting: Specialist

## 2020-03-31 ENCOUNTER — Other Ambulatory Visit: Payer: Self-pay | Admitting: Emergency Medicine

## 2020-04-24 ENCOUNTER — Telehealth: Payer: Self-pay | Admitting: Emergency Medicine

## 2020-04-24 NOTE — Telephone Encounter (Signed)
Pt is asking if he needs to increase his Eliquis, states he was feeling better when he was taking 5mg  BID.  Pt has been on 2.5mg  BID since May 2021.  Pt states that he has noticed he gets more short of breath with less exertion.  Denies any chest pain, leg pain, hemoptysis, sudden change of symptoms.  I advised that since we have not seen pt since 11/2019 we should get him in to be seen and discuss if something else can be changed besides his eliquis.  Pt requested an appt on 9/17 since he will be in [place] that morning.  I advised pt to seek urgent/emergency care if he starts having any sudden worsening symptoms.  Pt expressed understanding.

## 2020-05-09 ENCOUNTER — Ambulatory Visit: Payer: Medicare PPO | Admitting: Adult Health

## 2020-05-09 ENCOUNTER — Ambulatory Visit (INDEPENDENT_AMBULATORY_CARE_PROVIDER_SITE_OTHER): Payer: Medicare PPO

## 2020-05-09 ENCOUNTER — Encounter: Payer: Self-pay | Admitting: Adult Health

## 2020-05-09 ENCOUNTER — Other Ambulatory Visit: Payer: Self-pay

## 2020-05-09 ENCOUNTER — Ambulatory Visit (INDEPENDENT_AMBULATORY_CARE_PROVIDER_SITE_OTHER): Payer: Medicare PPO | Admitting: Specialist

## 2020-05-09 ENCOUNTER — Encounter: Payer: Self-pay | Admitting: Specialist

## 2020-05-09 VITALS — BP 140/66 | HR 82 | Temp 97.0°F | Ht 68.0 in | Wt 192.8 lb

## 2020-05-09 VITALS — BP 187/90 | HR 89 | Ht 68.5 in | Wt 190.0 lb

## 2020-05-09 DIAGNOSIS — M5136 Other intervertebral disc degeneration, lumbar region: Secondary | ICD-10-CM | POA: Diagnosis not present

## 2020-05-09 DIAGNOSIS — M48062 Spinal stenosis, lumbar region with neurogenic claudication: Secondary | ICD-10-CM

## 2020-05-09 DIAGNOSIS — M4726 Other spondylosis with radiculopathy, lumbar region: Secondary | ICD-10-CM | POA: Diagnosis not present

## 2020-05-09 DIAGNOSIS — M4807 Spinal stenosis, lumbosacral region: Secondary | ICD-10-CM | POA: Diagnosis not present

## 2020-05-09 DIAGNOSIS — R0600 Dyspnea, unspecified: Secondary | ICD-10-CM | POA: Diagnosis not present

## 2020-05-09 DIAGNOSIS — I2699 Other pulmonary embolism without acute cor pulmonale: Secondary | ICD-10-CM

## 2020-05-09 DIAGNOSIS — I739 Peripheral vascular disease, unspecified: Secondary | ICD-10-CM

## 2020-05-09 DIAGNOSIS — M4712 Other spondylosis with myelopathy, cervical region: Secondary | ICD-10-CM | POA: Diagnosis not present

## 2020-05-09 LAB — CBC WITH DIFFERENTIAL/PLATELET
Basophils Absolute: 0 10*3/uL (ref 0.0–0.1)
Basophils Relative: 0.5 % (ref 0.0–3.0)
Eosinophils Absolute: 0.2 10*3/uL (ref 0.0–0.7)
Eosinophils Relative: 1.7 % (ref 0.0–5.0)
HCT: 39.1 % (ref 39.0–52.0)
Hemoglobin: 13.1 g/dL (ref 13.0–17.0)
Lymphocytes Relative: 33.9 % (ref 12.0–46.0)
Lymphs Abs: 3.1 10*3/uL (ref 0.7–4.0)
MCHC: 33.4 g/dL (ref 30.0–36.0)
MCV: 90.4 fl (ref 78.0–100.0)
Monocytes Absolute: 0.4 10*3/uL (ref 0.1–1.0)
Monocytes Relative: 4.9 % (ref 3.0–12.0)
Neutro Abs: 5.3 10*3/uL (ref 1.4–7.7)
Neutrophils Relative %: 59 % (ref 43.0–77.0)
Platelets: 245 10*3/uL (ref 150.0–400.0)
RBC: 4.32 Mil/uL (ref 4.22–5.81)
RDW: 13.1 % (ref 11.5–15.5)
WBC: 9 10*3/uL (ref 4.0–10.5)

## 2020-05-09 LAB — BASIC METABOLIC PANEL
BUN: 22 mg/dL (ref 6–23)
CO2: 27 mEq/L (ref 19–32)
Calcium: 9 mg/dL (ref 8.4–10.5)
Chloride: 104 mEq/L (ref 96–112)
Creatinine, Ser: 1.64 mg/dL — ABNORMAL HIGH (ref 0.40–1.50)
GFR: 51.49 mL/min — ABNORMAL LOW (ref >60.00)
Glucose, Bld: 235 mg/dL — ABNORMAL HIGH (ref 70–99)
Potassium: 4.5 mEq/L (ref 3.5–5.1)
Sodium: 137 mEq/L (ref 135–145)

## 2020-05-09 LAB — BRAIN NATRIURETIC PEPTIDE: Pro B Natriuretic peptide (BNP): 21 pg/mL (ref 0.0–100.0)

## 2020-05-09 LAB — D-DIMER, QUANTITATIVE: D-Dimer, Quant: 0.23 mcg/mL FEU (ref <0.50)

## 2020-05-09 MED ORDER — FLUTICASONE PROPIONATE 50 MCG/ACT NA SUSP: 2.0000 | Freq: Every day | NASAL | 5 refills | Status: DC

## 2020-05-09 NOTE — Patient Instructions (Addendum)
Plan: Avoid overhead lifting and overhead use of the arms. Do not lift greater than 5 lbs. Adjust head rest in vehicle to prevent hyperextension if rear ended. Take extra precautions to avoid falling.Avoid bending, stooping and avoid lifting weights greater than 10 lbs. Avoid prolong standing and walking. Avoid frequent bending and stooping  No lifting greater than 10 lbs. May use ice or moist heat for pain. Weight loss is of benefit. Handicap license is approved.  Hemp CBD capsules, amazon.com 5,000-7,000 mg per bottle, 60 capsules per bottle, take one capsule twice a day. Do not use oral NSAIDS like motrin or naprosyn as these increase likihood of having a bleeding complication from the use of blood thinner in addition to the side effect of bruising due to the NSAIDs Follow-Up Instructions: No follow-ups on file.

## 2020-05-09 NOTE — Patient Instructions (Addendum)
Chest xray and labs today  Activity as tolerated.  Continue on Spiriva daily  Follow up with Dr. Delton Coombes  In 4 weeks and As needed   Please contact office for sooner follow up if symptoms do not improve or worsen or seek emergency care

## 2020-05-09 NOTE — Assessment & Plan Note (Signed)
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  Patient had significant improvement in symptoms on Eliquis.  He has decreased Eliquis dose over the last 6 months and has noticed a uptake of shortness of breath and decreased activity tolerance.  Questionable etiology.  We will begin with work-up chest x-ray.  Continue on his Spiriva. Check labs including CBC c-Met BNP and a D-dimer.  If D-dimer is elevated will consider CT chest.  Plan  Patient Instructions  Chest xray and labs today  Activity as tolerated.  Continue on Spiriva daily  Follow up with Dr. Lamonte Sakai  In 4 weeks and As needed   Please contact office for sooner follow up if symptoms do not improve or worsen or seek emergency care

## 2020-05-09 NOTE — Progress Notes (Signed)
'@Patient'  ID: Austin Savage, male    DOB: [birth year], 64 y.o.   MRN: [id number]  Chief Complaint  Patient presents with  . Follow-up    COPD /PE     Referring provider: Andres Shad, *  HPI: 64 year old male with minimum smoking history followed for unprovoked PE September 2020.  On lifelong anticoagulation with Eliquis at 2.5 mg twice daily  TEST/EVENTS :  Venous Dopplers negative   Antithrombin III activity normal, beta 2 glycoprotein antibodies negative, homocystine normal, protein C amount and activity normal, protein S amount and activity normal, factor V Leiden mutation negative, anticardiolipin antibodies negative.  Lupus anticoagulant negative.  His prothrombin gene negative  PFT done today, reviewed by me, show mixed obstruction and restriction,  FEV1 66 to 70% predicted.  Normal volumes, normal diffusion capacity.  Echocardiogram was done on 11/29/2019 and I have reviewed the results, shows intact LV function 60-65%, normal RV function and size without any TR.  PASP not estimated   05/09/2020 Follow up : PE , dyspnea , Asthma  Patient returns for a 10-monthfollow-up.  Patient has a history of unprovoked PE diagnosed September 2020.  He had a hypercoagulable panel that was negative as above.  He was treated with Eliquis with improvement in symptoms and transition to Eliquis 2.5 mg twice daily last visit. Patient says he had been doing well had return back to his normal activities able to mow the grass do household chores.  However since changing to 2.5 mg of Eliquis he has noticed that his shortness of breath has picked up he now has decreased activity tolerance.  Previous pulmonary function testing showed some mixed obstruction and restriction.  He was placed on Spiriva.  Patient says it has helped some but he continues to have some shortness of breath with activities mainly.  Patient is concerned and wants to go back on the Eliquis full dose as he is concerned that his  blood clots have returned. Patient denies any calf pain, calf swelling, hemoptysis, chest pain.  Patient has some intermittent dry cough and wheezing but is says it is minimal.   No Known Allergies  Immunization History  Administered Date(s) Administered  . Influenza Inj Mdck Quad With Preservative 06/15/2018  . Influenza, High Dose Seasonal PF 05/24/2019  . Moderna SARS-COVID-2 Vaccination 11/05/2019, 12/04/2019  . Pneumococcal Conjugate-13 06/07/2017    Past Medical History:  Diagnosis Date  . Arthritis   . Diabetes [place]   . Neuropathy   . Seasonal allergies   . Wears glasses    reading    Tobacco History: Social History   Tobacco Use  Smoking Status Former Smoker  . Packs/day: 0.25  . Years: 30.00  . Pack years: 7.50  . Types: Cigarettes  . Quit date: 10/24/1982  . Years since quitting: 37.5  Smokeless Tobacco Never Used   Counseling given: Not Answered   Outpatient Medications Prior to Visit  Medication Sig Dispense Refill  . amLODipine (NORVASC) 5 MG tablet Take 5 mg by mouth daily.    .Marland Kitchenapixaban (ELIQUIS) 2.5 MG TABS tablet Take 1 tablet (2.5 mg total) by mouth 2 (two) times daily. 60 tablet 5  . desonide (DESOWEN) 0.05 % cream     . diclofenac Sodium (VOLTAREN) 1 % GEL APPLY 4 GRAMS OF GEL TOPICALLY FOUR TIMES DAILY 300 g 0  . ferrous sulfate 325 (65 FE) MG tablet Take 325 mg by mouth 2 (two) times daily with a meal.    .  gabapentin (NEURONTIN) 300 MG capsule TAKE 2 CAPSULES BY MOUTH 3 TIMES DAILY 540 capsule 3  . glimepiride (AMARYL) 4 MG tablet Take 4 mg by mouth daily.    . insulin aspart (NOVOLOG) 100 UNIT/ML FlexPen     . insulin glargine (LANTUS) 100 UNIT/ML injection     . lisinopril (ZESTRIL) 20 MG tablet Take 20 mg by mouth daily.    . metFORMIN (GLUCOPHAGE) 500 MG tablet Take 500 mg by mouth daily.    . simvastatin (ZOCOR) 20 MG tablet Take 20 mg by mouth daily.    . Tiotropium Bromide Monohydrate (SPIRIVA RESPIMAT)  2.5 MCG/ACT AERS Inhale 2 puffs into the lungs daily. 4 g 5  . fluticasone (FLONASE) 50 MCG/ACT nasal spray USE 2 SPRAYS IN EACH NOSTRIL DAILY 16 g 0   No facility-administered medications prior to visit.     Review of Systems:   Constitutional:   No  weight loss, night sweats,  Fevers, chills, fatigue, or  lassitude.  HEENT:   No headaches,  Difficulty swallowing,  Tooth/dental problems, or  Sore throat,                No sneezing, itching, ear ache,  +nasal congestion, post nasal drip,   CV:  No chest pain,  Orthopnea, PND, swelling in lower extremities, anasarca, dizziness, palpitations, syncope.   GI  No heartburn, indigestion, abdominal pain, nausea, vomiting, diarrhea, change in bowel habits, loss of appetite, bloody stools.   Resp:    No chest wall deformity  Skin: no rash or lesions.  GU: no dysuria, change in color of urine, no urgency or frequency.  No flank pain, no hematuria   MS:  No joint pain or swelling.  No decreased range of motion.  No back pain.    Physical Exam  BP 140/66 (BP Location: Left Arm, Cuff Size: Normal)   Pulse 82   Temp (!) 97 F (36.1 C) (Temporal)   Ht '5\' 8"'  (1.727 m)   Wt 192 lb 12.8 oz (87.5 kg)   SpO2 97% Comment: RA  BMI 29.32 kg/m   GEN: A/Ox3; pleasant , NAD, well nourished    HEENT:  Orr/AT,   NOSE-clear, THROAT-clear, no lesions, no postnasal drip or exudate noted.   NECK:  Supple w/ fair ROM; no JVD; normal carotid impulses w/o bruits; no thyromegaly or nodules palpated; no lymphadenopathy.    RESP  Clear  P & A; w/o, wheezes/ rales/ or rhonchi. no accessory muscle use, no dullness to percussion  CARD:  RRR, no m/r/g, no peripheral edema, pulses intact, no cyanosis or clubbing.  GI:   Soft & nt; nml bowel sounds; no organomegaly or masses detected.   Musco: Warm bil, no deformities or joint swelling noted.   Neuro: alert, no focal deficits noted.    Skin: Warm, no lesions or rashes    Lab Results:   BNP No  results found for: BNP  ProBNP No results found for: PROBNP  Imaging: No results found.    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  Patient had significant improvement in symptoms on Eliquis.  He has decreased Eliquis dose over the last 6 months and has noticed a uptake of shortness of breath and decreased activity tolerance.  Questionable etiology.  We will begin with work-up chest x-ray.  Continue on his Spiriva. Check labs including CBC c-Met BNP and a D-dimer.  If D-dimer is elevated will consider CT chest.  Plan  Patient Instructions  Chest xray and labs today  Activity as tolerated.  Continue on Spiriva daily  Follow up with Dr. Lamonte Sakai  In 4 weeks and As needed   Please contact office for sooner follow up if symptoms do not improve or worsen or seek emergency care       Dyspnea Questionable etiology.  Check chest x-ray labs today.  Continue on Spiriva.  For now continue on Eliquis 2.5 mg.  If D-dimer is elevated consider CT chest with PE protocol.  Plan  Patient Instructions  Chest xray and labs today  Activity as tolerated.  Continue on Spiriva daily  Follow up with Dr. Lamonte Sakai  In 4 weeks and As needed   Please contact office for sooner follow up if symptoms do not improve or worsen or seek emergency care          Rexene Edison, NP 05/09/2020

## 2020-05-09 NOTE — Progress Notes (Signed)
Office Visit Note   Patient: Austin Savage           Date of Birth: 1956/06/22           MRN: [id number] Visit Date: 05/09/2020              Requested by: Austin Robes, MD [address] [place],  [place] [address] PCP: Austin Robes, MD   Assessment & Plan: Visit Diagnoses:  1. Degenerative disc disease, lumbar   2. Spinal stenosis of lumbosacral region   3. Other spondylosis with myelopathy, cervical region   4. Other spondylosis with radiculopathy, lumbar region   5. Right leg claudication (HCC)   6. Spinal stenosis of lumbar region with neurogenic claudication     Plan: Avoid overhead lifting and overhead use of the arms. Do not lift greater than 5 lbs. Adjust head rest in vehicle to prevent hyperextension if rear ended. Take extra precautions to avoid falling.Avoid bending, stooping and avoid lifting weights greater than 10 lbs. Avoid prolong standing and walking. Avoid frequent bending and stooping  No lifting greater than 10 lbs. May use ice or moist heat for pain. Weight loss is of benefit. Handicap license is approved.  Hemp CBD capsules, amazon.com 5,000-7,000 mg per bottle, 60 capsules per bottle, take one capsule twice a day.  Follow-Up Instructions: No follow-ups on file.    Follow-Up Instructions: No follow-ups on file.   Orders:  No orders of the defined types were placed in this encounter.  No orders of the defined types were placed in this encounter.     Procedures: No procedures performed   Clinical Data: No additional findings.   Subjective: Chief Complaint  Patient presents with  . Lower Back - Follow-up    Doing so-so  . Right Shoulder - Follow-up    Doing just fine    64 year old male with history of cervical spondylosis and lumbar DDD and spondylosis. He has history of gradual  Stiffness in the neck and lumbar area with claudication symptoms. He is on disability and is taking eliquis and antihtn  Meds. He is  noticing increased leg buttock and thigh burning pain that improves with sitting but also with standing and Stopping walking. No bowel or bladder difficulties. No pain with cough or sneeze.   Review of Systems  Constitutional: Negative.   HENT: Negative.   Eyes: Negative.   Respiratory: Negative.   Cardiovascular: Negative.   Gastrointestinal: Negative.   Endocrine: Negative.   Genitourinary: Negative.   Musculoskeletal: Negative.   Skin: Negative.   Allergic/Immunologic: Negative.   Neurological: Negative.   Hematological: Negative.   Psychiatric/Behavioral: Negative.      Objective: Vital Signs: BP (!) 187/90 (BP Location: Left Arm, Patient Position: Sitting)   Pulse 89   Ht 5' 8.5" (1.74 m)   Wt 190 lb (86.2 kg)   BMI 28.47 kg/m   Physical Exam Constitutional:      Appearance: He is well-developed.  HENT:     Head: Normocephalic and atraumatic.  Eyes:     Pupils: Pupils are equal, round, and reactive to light.  Pulmonary:     Effort: Pulmonary effort is normal.     Breath sounds: Normal breath sounds.  Abdominal:     General: Bowel sounds are normal.     Palpations: Abdomen is soft.  Musculoskeletal:     Cervical back: Normal range of motion and neck supple.     Lumbar back: Negative right straight leg raise  test and negative left straight leg raise test.  Skin:    General: Skin is warm and dry.  Neurological:     Mental Status: He is alert and oriented to person, place, and time.  Psychiatric:        Behavior: Behavior normal.        Thought Content: Thought content normal.        Judgment: Judgment normal.     Back Exam   Tenderness  The patient is experiencing tenderness in the cervical and lumbar.  Range of Motion  Extension: abnormal  Flexion: abnormal  Lateral bend right: abnormal  Lateral bend left: abnormal  Rotation right: abnormal  Rotation left: abnormal   Muscle Strength  Right Quadriceps:  5/5  Left Quadriceps:  5/5  Right  Hamstrings:  5/5  Left Hamstrings:  5/5   Tests  Straight leg raise right: negative Straight leg raise left: negative  Other  Toe walk: normal Heel walk: normal Sensation: normal      Specialty Comments:  No specialty comments available.  Imaging: No results found.   PMFS History: Patient Active Problem List   Diagnosis Date Noted  . Carpal tunnel syndrome of right wrist 10/29/2013    Priority: High    Class: Chronic  . De Quervain's tenosynovitis, right 10/29/2013    Priority: High    Class: Chronic  . Allergic rhinitis 11/27/2019  . Acute pulmonary embolism (HCC) 09/26/2019  . Dyspnea 09/26/2019   Past Medical History:  Diagnosis Date  . Arthritis   . Diabetes mellitus without complication (HCC)   . Neuropathy   . Seasonal allergies   . Wears glasses    reading    History reviewed. No pertinent family history.  Past Surgical History:  Procedure Laterality Date  . CARPAL TUNNEL RELEASE Right 10/29/2013   Procedure: RIGHT OPEN CARPAL TUNNEL RELEASE;  Surgeon: Austin Champagne, MD;  Location: [facility];  Service: Orthopedics;  Laterality: Right;  . DORSAL COMPARTMENT RELEASE Right 10/29/2013   Procedure: RIGHT DORSAL COMPARTMENT RELEASE (DEQUERVAIN);  Surgeon: Austin Champagne, MD;  Location: [facility];  Service: Orthopedics;  Laterality: Right;  . 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 Not on file  Tobacco Use  . Smoking status: Former Smoker    Packs/day: 0.25    Years: 30.00    Pack years: 7.50    Types: Cigarettes    Quit date: 10/24/1982    Years since quitting: 37.5  . Smokeless tobacco: Never Used  Vaping Use  . Vaping Use: Never used  Substance and Sexual Activity  . Alcohol use: No  . Drug use: No  . Sexual activity: Not on file

## 2020-05-09 NOTE — Assessment & Plan Note (Signed)
Questionable etiology.  Check chest x-ray labs today.  Continue on Spiriva.  For now continue on Eliquis 2.5 mg.  If D-dimer is elevated consider CT chest with PE protocol.  Plan  Patient Instructions  Chest xray and labs today  Activity as tolerated.  Continue on Spiriva daily  Follow up with Dr. Delton Coombes  In 4 weeks and As needed   Please contact office for sooner follow up if symptoms do not improve or worsen or seek emergency care

## 2020-05-13 NOTE — Progress Notes (Signed)
Contacted patient with results of recent lab work. Provider notes from Rubye Oaks NP reviewed. Patient is seeing the kidney doctor next week and see his PCP next month. He verbalized understanding of results and plan of care. Emergency plan reviewed, contact our office or seek emergent care if symptoms worsen or do not improve.

## 2020-05-27 ENCOUNTER — Other Ambulatory Visit: Payer: Self-pay | Admitting: Internal Medicine

## 2020-05-27 DIAGNOSIS — I129 Hypertensive chronic kidney disease with stage 1 through stage 4 chronic kidney disease, or unspecified chronic kidney disease: Secondary | ICD-10-CM

## 2020-05-27 DIAGNOSIS — N1831 Chronic kidney disease, stage 3a: Secondary | ICD-10-CM

## 2020-05-27 DIAGNOSIS — E1122 Type 2 diabetes mellitus with diabetic chronic kidney disease: Secondary | ICD-10-CM

## 2020-06-09 ENCOUNTER — Encounter: Payer: Self-pay | Admitting: Emergency Medicine

## 2020-06-09 ENCOUNTER — Ambulatory Visit: Payer: Medicare PPO | Admitting: Emergency Medicine

## 2020-06-09 ENCOUNTER — Other Ambulatory Visit: Payer: Self-pay

## 2020-06-09 ENCOUNTER — Ambulatory Visit
Admission: RE | Admit: 2020-06-09 | Discharge: 2020-06-09 | Disposition: A | Discharge status: Home / Self Care | Payer: Medicare PPO | Source: Ambulatory Visit | Attending: Internal Medicine | Admitting: Internal Medicine

## 2020-06-09 DIAGNOSIS — J301 Allergic rhinitis due to pollen: Secondary | ICD-10-CM | POA: Diagnosis not present

## 2020-06-09 DIAGNOSIS — E1122 Type 2 diabetes mellitus with diabetic chronic kidney disease: Secondary | ICD-10-CM

## 2020-06-09 DIAGNOSIS — J449 Chronic obstructive pulmonary disease, unspecified: Secondary | ICD-10-CM | POA: Diagnosis not present

## 2020-06-09 DIAGNOSIS — I2699 Other pulmonary embolism without acute cor pulmonale: Secondary | ICD-10-CM

## 2020-06-09 DIAGNOSIS — I129 Hypertensive chronic kidney disease with stage 1 through stage 4 chronic kidney disease, or unspecified chronic kidney disease: Secondary | ICD-10-CM

## 2020-06-09 DIAGNOSIS — J4489 Other specified chronic obstructive pulmonary disease: Secondary | ICD-10-CM

## 2020-06-09 DIAGNOSIS — N1831 Chronic kidney disease, stage 3a: Secondary | ICD-10-CM

## 2020-06-09 MED ORDER — APIXABAN 2.5 MG PO TABS: 2.5000 mg | ORAL_TABLET | Freq: Two times a day (BID) | ORAL | 5 refills | Status: DC

## 2020-06-09 MED ORDER — ALBUTEROL SULFATE HFA 108 (90 BASE) MCG/ACT IN AERS: 2.0000 | INHALATION_SPRAY | Freq: Four times a day (QID) | RESPIRATORY_TRACT | 6 refills | Status: DC | PRN

## 2020-06-09 NOTE — Progress Notes (Signed)
   Subjective:    Patient ID: Austin Savage, male    DOB: [birth year], 64 y.o.   MRN: [id number]  HPI 64 year old former minimal smoker (2 pk/yrs) with a history of diabetes, hypertension, allergic rhinitis, lumbar DDD.   ROV 06/09/20 --64 year old male who follows up today for unprovoked bilateral pulmonary emboli 04/2019 treated with Eliquis, decreased to 2.5 mg daily prophylaxis dose.  He has mixed obstruction and restriction on pulmonary function testing, intact RV function on echocardiogram done 11/29/2019.  Hyper coag panel negative including the prothrombin gene 12/14/2019.  He was seen 1 month ago with slowly progressive dyspnea and decreased exertional tolerance.  He is on Spiriva for his mixed obstruction and restriction.  CBC, BNP, D-dimer reassuring on 05/09/2020.  He did have evidence for progressive renal insufficiency.  Chest x-ray 05/09/2020 reviewed by me was normal. He reports that his breathing is a bit better, is on Spiriva respimat - he does believe that he benefits some from this. Remains on an ACE-I. Uses flonase, still has some nasal gtt, throat wheeze. He doesn't have an albuterol inhaler.    Review of Systems As above     Objective:   Physical Exam Vitals:   06/09/20 1056  BP: 134/78  Pulse: 82  Temp: 97.9 F (36.6 C)  TempSrc: Oral  SpO2: 98%  Weight: 192 lb 12.8 oz (87.5 kg)  Height: 5\' 8"  (1.727 m)   Gen: Pleasant, obese man, in no distress,  normal affect  ENT: No lesions,  mouth clear,  oropharynx clear, no postnasal drip  Neck: No JVD, no stridor  Lungs: No use of accessory muscles, no crackles or wheezing on normal respiration, no wheeze on forced expiration  Cardiovascular: RRR, heart sounds normal, no murmur or gallops, no peripheral edema  Musculoskeletal: No deformities, no cyanosis or clubbing  Neuro: alert, awake, non focal  Skin: Warm, no lesions or rash      Assessment & Plan:  Pulmonary embolism (HCC) Unprovoked clot.  No evidence of  recurrence based on D-dimer from April.  We have decided to continue Eliquis 2.5 mg twice daily indefinitely for prevention.  He is tolerating well.  We will refill today.  Allergic rhinitis Still symptomatic.  Discussed increasing his reticular nasal spray to twice daily.  He will try this to see if he gets benefit.  COPD with asthma (HCC) Has gotten some benefit from the addition of Spiriva Respimat.  We will continue.  He does not have an albuterol inhaler.  Discussed the rationale for this today and we will give him a prescription for it.  May, MD, PhD 06/09/2020, 11:33 AM [facility] [phone] or if no answer [phone]

## 2020-06-09 NOTE — Assessment & Plan Note (Signed)
Still symptomatic.  Discussed increasing his reticular nasal spray to twice daily.  He will try this to see if he gets benefit.

## 2020-06-09 NOTE — Addendum Note (Signed)
Addended by: Dorisann Frames R on: 06/09/2020 12:34 PM   Modules accepted: Orders

## 2020-06-09 NOTE — Assessment & Plan Note (Signed)
Has gotten some benefit from the addition of Spiriva Respimat.  We will continue.  He does not have an albuterol inhaler.  Discussed the rationale for this today and we will give him a prescription for it.

## 2020-06-09 NOTE — Patient Instructions (Signed)
Please continue Eliquis 2.5 mg twice a day.  We will refill this for you today. Continue Spiriva Respimat 2 sprays once daily. We will start albuterol.  You can use 2 puffs up to every 4 hours if you need it for shortness of breath, chest tightness, wheezing. Please continue your fluticasone nasal spray.  You could increase this to 2 puffs each nostril twice a day to see if this helps with your nasal drainage. COVID-19 and flu shots are all up-to-date Follow with Dr Delton Coombes in 6 months or sooner if you have any problems

## 2020-06-09 NOTE — Assessment & Plan Note (Signed)
Unprovoked clot.  No evidence of recurrence based on D-dimer from April.  We have decided to continue Eliquis 2.5 mg twice daily indefinitely for prevention.  He is tolerating well.  We will refill today.

## 2020-06-17 ENCOUNTER — Other Ambulatory Visit: Payer: Self-pay | Admitting: Internal Medicine

## 2020-06-17 DIAGNOSIS — N1831 Chronic kidney disease, stage 3a: Secondary | ICD-10-CM

## 2020-07-16 ENCOUNTER — Other Ambulatory Visit: Payer: Self-pay | Admitting: Primary Care

## 2020-07-16 MED ORDER — SPIRIVA RESPIMAT 2.5 MCG/ACT IN AERS: 2.0000 | INHALATION_SPRAY | Freq: Every day | RESPIRATORY_TRACT | 5 refills | Status: DC

## 2020-07-16 NOTE — Telephone Encounter (Signed)
Patient called service requesting refill Spiriva. Sent

## 2020-07-16 NOTE — Progress Notes (Unsigned)
Patient called service for refill of Spiriva. Sent.

## 2020-10-15 ENCOUNTER — Telehealth: Payer: Self-pay | Admitting: Emergency Medicine

## 2020-10-15 DIAGNOSIS — J449 Chronic obstructive pulmonary disease, unspecified: Secondary | ICD-10-CM

## 2020-10-15 MED ORDER — INCRUSE ELLIPTA 62.5 MCG/INH IN AEPB: 1.0000 | INHALATION_SPRAY | Freq: Every day | RESPIRATORY_TRACT | 6 refills | Status: DC

## 2020-10-15 NOTE — Telephone Encounter (Signed)
Received fax from pt's pharmacy in regards to pt's Spiriva Respimat 2.15mcg inhaler.   Notes to prescriber: Preferred inhaler is Incruse Ellipta. Dr. Delton Coombes, please advise.

## 2020-10-15 NOTE — Telephone Encounter (Signed)
Okay to change to Incruse.  Thank you

## 2020-11-06 ENCOUNTER — Encounter: Payer: Self-pay | Admitting: Specialist

## 2020-11-06 ENCOUNTER — Other Ambulatory Visit: Payer: Self-pay

## 2020-11-06 ENCOUNTER — Ambulatory Visit: Payer: Medicare PPO | Admitting: Specialist

## 2020-11-06 VITALS — BP 156/79 | HR 76 | Ht 68.5 in | Wt 193.0 lb

## 2020-11-06 DIAGNOSIS — M4726 Other spondylosis with radiculopathy, lumbar region: Secondary | ICD-10-CM | POA: Diagnosis not present

## 2020-11-06 DIAGNOSIS — M4712 Other spondylosis with myelopathy, cervical region: Secondary | ICD-10-CM | POA: Diagnosis not present

## 2020-11-06 DIAGNOSIS — M48062 Spinal stenosis, lumbar region with neurogenic claudication: Secondary | ICD-10-CM | POA: Diagnosis not present

## 2020-11-06 DIAGNOSIS — M7672 Peroneal tendinitis, left leg: Secondary | ICD-10-CM

## 2020-11-06 DIAGNOSIS — M5136 Other intervertebral disc degeneration, lumbar region: Secondary | ICD-10-CM | POA: Diagnosis not present

## 2020-11-06 MED ORDER — DICLOFENAC SODIUM 1 % EX GEL: 2.0000 g | Freq: Four times a day (QID) | CUTANEOUS | 3 refills | Status: DC

## 2020-11-06 MED ORDER — GABAPENTIN 300 MG PO CAPS
ORAL_CAPSULE | ORAL | 3 refills | Status: DC
Start: 2020-11-06 — End: 2021-08-18

## 2020-11-06 NOTE — Addendum Note (Signed)
Addended by: Vira Browns on: 11/06/2020 12:35 PM   Modules accepted: Orders

## 2020-11-06 NOTE — Progress Notes (Signed)
Office Visit Note   Patient: Austin Savage           Date of Birth: [birth year]           MRN: [id number] Visit Date: 11/06/2020              Requested by: Arlina Robes, MD [address] [place],  [place] [address] PCP: Arlina Robes, MD   Assessment & Plan: Visit Diagnoses:  1. Degenerative disc disease, lumbar   2. Other spondylosis with radiculopathy, lumbar region   3. Other spondylosis with myelopathy, cervical region   4. Spinal stenosis of lumbar region with neurogenic claudication   5. Peroneal tendonitis of left lower leg     Plan:Arthritis meds for foot tendonitis help but due to blood thinner you can not use them. Arthritis strength tylenol. Use transdermal CBD oil or voltaren gel .  Heel cord stretching exercises and use of a good arch support. Speak with your primary care MD or Cardiologist about the use of voltaren gel and whether they approve of its use. Water massage at the end of the day.   Avoid frequent bending and stooping  No lifting greater than 10 lbs. May use ice or moist heat for pain. Weight loss is of benefit. Best medication for lumbar disc disease is arthritis medications that you can not take.Plan: Knee is suffering from osteoarthritis, only real proven treatments are  Hemp CBD capsules, amazon.com 5,000-7,000 mg per bottle, 60 capsules per bottle, take one capsule twice a day. Cane in the left hand to use with left leg weight bearing. Follow-Up Instructions: No follow-ups on file.  Exercise is important to improve your indurance and does allow people to function better inspite of back pain.  Avoid overhead lifting and overhead use of the arms. Do not lift greater than 5 lbs. Adjust head rest in vehicle to prevent hyperextension if rear ended. Take extra precautions to avoid falling.  Follow-Up Instructions: No follow-ups on file.   Orders:  No orders of the defined types were placed in this encounter.  No orders of the  defined types were placed in this encounter.     Procedures: No procedures performed   Clinical Data: No additional findings.   Subjective: Chief Complaint  Patient presents with  . Lower Back - Follow-up    6 month ROV    65 year old male with history of cervical spondylosis, lumbar DDD ans spondylosis and bilateral foot pain with prolong standing and walking.  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 No bowel or bladder difficulty. Having pain into the mid foot laterally that is worse with standing and uneven ground.   Review of Systems  Constitutional: Negative.   HENT: Negative.   Eyes: Negative.   Respiratory: Negative.   Cardiovascular: Negative.   Gastrointestinal: Negative.   Endocrine: Negative.   Genitourinary: Negative.   Musculoskeletal: Negative.   Skin: Negative.   Allergic/Immunologic: Negative.   Neurological: Negative.   Hematological: Negative.   Psychiatric/Behavioral: Negative.      Objective: Vital Signs: BP (!) 156/79 (BP Location: Left Arm, Patient Position: Sitting)   Pulse 76   Ht 5' 8.5" (1.74 m)   Wt 193 lb (87.5 kg)   BMI 28.92 kg/m   Physical Exam Constitutional:      Appearance: He is well-developed.  HENT:     Head: Normocephalic and atraumatic.  Eyes:     Pupils: Pupils are equal, round, and reactive  to light.  Pulmonary:     Effort: Pulmonary effort is normal.     Breath sounds: Normal breath sounds.  Abdominal:     General: Bowel sounds are normal.     Palpations: Abdomen is soft.  Musculoskeletal:        General: Normal range of motion.     Cervical back: Normal range of motion and neck supple.     Lumbar back: Negative right straight leg raise test and negative left straight leg raise test.  Skin:    General: Skin is warm and dry.  Neurological:     Mental Status: He is alert and oriented to person, place, and time.  Psychiatric:        Behavior: Behavior normal.        Thought Content:  Thought content normal.        Judgment: Judgment normal.     Back Exam   Tenderness  The patient is experiencing tenderness in the cervical and lumbar.  Range of Motion  Extension: 70  Flexion: 70  Lateral bend right: 70  Lateral bend left: 70  Rotation right: 70  Rotation left: 70   Muscle Strength  Right Quadriceps:  5/5  Left Quadriceps:  5/5  Right Hamstrings:  5/5   Tests  Straight leg raise right: negative Straight leg raise left: negative  Other  Toe walk: normal Heel walk: normal Sensation: normal Erythema: no back redness Scars: absent  Comments:  Has limited ROM of the hips right greater than left with decreased IR and flexion. Pian in the foot is left lateraal mid foot in the area of the insertion of the peroneal tendon.      Specialty Comments:  No specialty comments available.  Imaging: No results found.   PMFS History: Patient Active Problem List   Diagnosis Date Noted  . Carpal tunnel syndrome of right wrist 10/29/2013    Priority: High    Class: Chronic  . De Quervain's tenosynovitis, right 10/29/2013    Priority: High    Class: Chronic  . COPD with asthma (HCC) 06/09/2020  . Allergic rhinitis 11/27/2019  . Pulmonary embolism (HCC) 09/26/2019  . Dyspnea 09/26/2019   Past Medical History:  Diagnosis Date  . Arthritis   . Diabetes mellitus without complication (HCC)   . Neuropathy   . Seasonal allergies   . Wears glasses    reading    No family history on file.  Past Surgical History:  Procedure Laterality Date  . CARPAL TUNNEL RELEASE Right 10/29/2013   Procedure: RIGHT OPEN CARPAL TUNNEL RELEASE;  Surgeon: Kerrin Champagne, MD;  Location: [facility];  Service: Orthopedics;  Laterality: Right;  . DORSAL COMPARTMENT RELEASE Right 10/29/2013   Procedure: RIGHT DORSAL COMPARTMENT RELEASE (DEQUERVAIN);  Surgeon: Kerrin Champagne, MD;  Location: [facility];  Service: Orthopedics;  Laterality: Right;  . 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 Not on file  Tobacco Use  . Smoking status: Former Smoker    Packs/day: 0.25    Years: 30.00    Pack years: 7.50    Types: Cigarettes    Quit date: 10/24/1982    Years since quitting: 38.0  . Smokeless tobacco: Never Used  Vaping Use  . Vaping Use: Never used  Substance and Sexual Activity  . Alcohol use: No  . Drug use: No  . Sexual activity: Not on file

## 2020-11-06 NOTE — Patient Instructions (Signed)
Plan:Arthritis meds for foot tendonitis help but due to blood thinner you can not use them. Arthritis strength tylenol. Use transdermal CBD oil or voltaren gel .  Heel cord stretching exercises and use of a good arch support. Speak with your primary care MD or Cardiologist about the use of voltaren gel and whether they approve of its use. Water massage at the end of the day.   Avoid frequent bending and stooping  No lifting greater than 10 lbs. May use ice or moist heat for pain. Weight loss is of benefit. Best medication for lumbar disc disease is arthritis medications that you can not take.Plan: Knee is suffering from osteoarthritis, only real proven treatments are  Hemp CBD capsules, amazon.com 5,000-7,000 mg per bottle, 60 capsules per bottle, take one capsule twice a day. Cane in the left hand to use with left leg weight bearing. Follow-Up Instructions: No follow-ups on file.  Exercise is important to improve your indurance and does allow people to function better inspite of back pain.  Avoid overhead lifting and overhead use of the arms. Do not lift greater than 5 lbs. Adjust head rest in vehicle to prevent hyperextension if rear ended. Take extra precautions to avoid falling.  Follow-Up Instructions: No follow-ups on file

## 2020-11-26 ENCOUNTER — Other Ambulatory Visit: Payer: Self-pay | Admitting: Adult Health

## 2020-12-08 ENCOUNTER — Ambulatory Visit: Payer: Medicare PPO

## 2020-12-08 ENCOUNTER — Ambulatory Visit (HOSPITAL_COMMUNITY): Payer: Medicare PPO

## 2020-12-10 ENCOUNTER — Other Ambulatory Visit: Payer: Self-pay | Admitting: Emergency Medicine

## 2020-12-18 ENCOUNTER — Ambulatory Visit
Admission: RE | Admit: 2020-12-18 | Discharge: 2020-12-18 | Disposition: A | Discharge status: Home / Self Care | Payer: Medicare PPO | Source: Ambulatory Visit | Attending: Internal Medicine | Admitting: Internal Medicine

## 2020-12-18 DIAGNOSIS — N1831 Chronic kidney disease, stage 3a: Secondary | ICD-10-CM

## 2020-12-24 ENCOUNTER — Other Ambulatory Visit: Payer: Self-pay

## 2020-12-24 ENCOUNTER — Ambulatory Visit: Payer: Medicare PPO | Admitting: Physician Assistant

## 2020-12-24 ENCOUNTER — Ambulatory Visit (HOSPITAL_COMMUNITY)
Admission: RE | Admit: 2020-12-24 | Discharge: 2020-12-24 | Disposition: A | Discharge status: Home / Self Care | Payer: Medicare PPO | Source: Ambulatory Visit | Attending: Vascular Surgery | Admitting: Vascular Surgery

## 2020-12-24 ENCOUNTER — Encounter: Payer: Self-pay | Admitting: Physician Assistant

## 2020-12-24 VITALS — BP 153/84 | HR 83 | Temp 98.0°F | Resp 20 | Ht 68.5 in

## 2020-12-24 DIAGNOSIS — I779 Disorder of arteries and arterioles, unspecified: Secondary | ICD-10-CM | POA: Diagnosis not present

## 2020-12-24 DIAGNOSIS — Z87891 Personal history of nicotine dependence: Secondary | ICD-10-CM | POA: Diagnosis not present

## 2020-12-24 DIAGNOSIS — I739 Peripheral vascular disease, unspecified: Secondary | ICD-10-CM

## 2020-12-24 NOTE — Progress Notes (Signed)
Office Note     CC:  follow up Requesting Provider:  Arlina Robes, *  HPI: Austin Savage is a 65 y.o. (18-Feb-1956) male who presents to go over vascular studies related to PAD.  He was last seen in the office about 1 year ago.  He denies any new symptoms including claudication, rest pain, or nonhealing wounds of bilateral lower extremities.  He does have a pressure wound on his right second toe however patient states it is getting smaller and healing.  Patient states he does not go out much ever since the COVID pandemic started however he does not experience any claudication when walking.  He is a former tobacco user.  He is on Eliquis for history of PE in September 2020.  Past medical history also significant for insulin-dependent diabetes mellitus.  He is on a statin daily.   Past Medical History:  Diagnosis Date  . Arthritis   . Diabetes mellitus without complication (HCC)   . Neuropathy   . Seasonal allergies   . Wears glasses    reading    Past Surgical History:  Procedure Laterality Date  . CARPAL TUNNEL RELEASE Right 10/29/2013   Procedure: RIGHT OPEN CARPAL TUNNEL RELEASE;  Surgeon: Kerrin Champagne, MD;  Location: [facility];  Service: Orthopedics;  Laterality: Right;  . DORSAL COMPARTMENT RELEASE Right 10/29/2013   Procedure: RIGHT DORSAL COMPARTMENT RELEASE (DEQUERVAIN);  Surgeon: Kerrin Champagne, MD;  Location: [facility];  Service: Orthopedics;  Laterality: Right;  . FINGER AMPUTATION  2000   accidental middle right finger    Social History   Socioeconomic History  . Marital status: Married    Spouse name: Pearletha Alfred  . Number of children: 3  . Years of education: college  . Highest education level: Not on file  Occupational History  . Not on file  Tobacco Use  . Smoking status: Former Smoker    Packs/day: 0.25    Years: 30.00    Pack years: 7.50    Types: Cigarettes    Quit date: 10/24/1982    Years since quitting: 38.1  .  Smokeless tobacco: Never Used  Vaping Use  . Vaping Use: Never used  Substance and Sexual Activity  . Alcohol use: No  . Drug use: No  . Sexual activity: Not on file  Other Topics Concern  . Not on file  Social History Narrative  . Not on file   Social Determinants of Health   Financial Resource Strain: Not on file  Food Insecurity: Not on file  Transportation Needs: Not on file  Physical Activity: Not on file  Stress: Not on file  Social Connections: Not on file  Intimate Partner Violence: Not on file   History reviewed. No pertinent family history.  Current Outpatient Medications  Medication Sig Dispense Refill  . amLODipine (NORVASC) 5 MG tablet Take 5 mg by mouth daily.    Marland Kitchen desonide (DESOWEN) 0.05 % cream     . diclofenac Sodium (VOLTAREN) 1 % GEL APPLY 4 GRAMS OF GEL TOPICALLY FOUR TIMES DAILY 300 g 0  . diclofenac Sodium (VOLTAREN) 1 % GEL Apply 2 g topically 4 (four) times daily. 350 g 3  . ELIQUIS 2.5 MG TABS tablet Take 1 tablet by mouth twice daily 60 tablet 5  . ferrous sulfate 325 (65 FE) MG tablet Take 325 mg by mouth 2 (two) times daily with a meal.    . fluticasone (FLONASE) 50 MCG/ACT nasal spray Use 2  spray(s) in each nostril once daily 16 g 2  . gabapentin (NEURONTIN) 300 MG capsule TAKE 2 CAPSULES BY MOUTH 3 TIMES DAILY 540 capsule 3  . glimepiride (AMARYL) 4 MG tablet Take 4 mg by mouth daily.    . insulin aspart (NOVOLOG) 100 UNIT/ML FlexPen     . insulin glargine (LANTUS) 100 UNIT/ML injection     . JARDIANCE 10 MG TABS tablet     . lisinopril (ZESTRIL) 20 MG tablet Take 20 mg by mouth daily.    . simvastatin (ZOCOR) 20 MG tablet Take 20 mg by mouth daily.    Marland Kitchen umeclidinium bromide (INCRUSE ELLIPTA) 62.5 MCG/INH AEPB Inhale 1 puff into the lungs daily. 30 each 6  . VENTOLIN HFA 108 (90 Base) MCG/ACT inhaler INHALE 2 PUFFS BY MOUTH EVERY 6 HOURS AS NEEDED FOR WHEEZING FOR SHORTNESS OF BREATH 18 g 0  . metFORMIN (GLUCOPHAGE) 500 MG tablet Take 500 mg by  mouth daily. (Patient not taking: Reported on 12/24/2020)     No current facility-administered medications for this visit.    No Known Allergies   REVIEW OF SYSTEMS:   [X]  denotes positive finding, [ ]  denotes negative finding Cardiac  Comments:  Chest pain or chest pressure:    Shortness of breath upon exertion:    Short of breath when lying flat:    Irregular heart rhythm:        Vascular    Pain in calf, thigh, or hip brought on by ambulation:    Pain in feet at night that wakes you up from your sleep:     Blood clot in your veins:    Leg swelling:         Pulmonary    Oxygen at home:    Productive cough:     Wheezing:         Neurologic    Sudden weakness in arms or legs:     Sudden numbness in arms or legs:     Sudden onset of difficulty speaking or slurred speech:    Temporary loss of vision in one eye:     Problems with dizziness:         Gastrointestinal    Blood in stool:     Vomited blood:         Genitourinary    Burning when urinating:     Blood in urine:        Psychiatric    Major depression:         Hematologic    Bleeding problems:    Problems with blood clotting too easily:        Skin    Rashes or ulcers:        Constitutional    Fever or chills:      PHYSICAL EXAMINATION:  Vitals:   12/24/20 0927  BP: (!) 153/84  Pulse: 83  Resp: 20  Temp: 98 F (36.7 C)  TempSrc: Temporal  SpO2: 98%  Height: 5' 8.5" (1.74 m)    General:  WDWN in NAD; vital signs documented above Gait: Not observed HENT: WNL, normocephalic Pulmonary: normal non-labored breathing  Cardiac: regular HR Abdomen: soft, NT, no masses Skin: without rashes Vascular Exam/Pulses:  Right Left  Radial 2+ (normal) 2+ (normal)  DP absent absent  PT absent absent   Extremities: Pressure wound of right second toe Musculoskeletal: no muscle wasting or atrophy  Neurologic: A&O X 3;  No focal weakness or paresthesias are detected Psychiatric:  The  pt has Normal  affect.   Non-Invasive Vascular Imaging:   ABI/TBIToday's ABIToday's TBIPrevious ABIPrevious TBI  +-------+-----------+-----------+------------+------------+  Right 0.99    0.34    1.37    0.31      +-------+-----------+-----------+------------+------------+  Left  0.79    0.26    0.84    0.42      ASSESSMENT/PLAN:: 65 y.o. male here for surveillance of PAD  -Patient does not have any claudication or rest pain.  He does have a small superficial pressure wound of right second toe however he believes this is healing and is getting smaller -ABIs are falsely elevated as he likely has calcified tibial vessels and small vessel disease given history of insulin-dependent diabetes mellitus -Plan will be to repeat ABIs in 1 year; I stressed the importance of patient letting us know if this right second toe wound worsens or fails to heal in which case we would consider aortogram with right lower extremity angiography to improve blood flow   Emilie Rutter, PA-C [facility] [phone]  Clinic MD:   Darrick Penna

## 2020-12-26 ENCOUNTER — Telehealth: Payer: Self-pay | Admitting: Specialist

## 2020-12-26 NOTE — Telephone Encounter (Signed)
Patient called requesting a call back from [place]. Patient did not disclose reasoning for call. Please call patient at [phone]

## 2020-12-29 NOTE — Telephone Encounter (Signed)
I called and patient states that he did not call our office on Friday.

## 2021-01-02 ENCOUNTER — Telehealth: Payer: Self-pay | Admitting: Specialist

## 2021-01-02 NOTE — Telephone Encounter (Signed)
Patient submitted medical release form, FMLA forms, and $25.00 cash payment to [employer]. Accepted 01/02/21

## 2021-02-06 ENCOUNTER — Ambulatory Visit (INDEPENDENT_AMBULATORY_CARE_PROVIDER_SITE_OTHER): Payer: Medicare PPO | Admitting: Specialist

## 2021-02-06 ENCOUNTER — Ambulatory Visit: Payer: Self-pay

## 2021-02-06 ENCOUNTER — Encounter: Payer: Self-pay | Admitting: Specialist

## 2021-02-06 ENCOUNTER — Other Ambulatory Visit: Payer: Self-pay

## 2021-02-06 VITALS — BP 144/79 | HR 80 | Ht 68.5 in | Wt 193.0 lb

## 2021-02-06 DIAGNOSIS — M4726 Other spondylosis with radiculopathy, lumbar region: Secondary | ICD-10-CM | POA: Diagnosis not present

## 2021-02-06 DIAGNOSIS — M5136 Other intervertebral disc degeneration, lumbar region: Secondary | ICD-10-CM | POA: Diagnosis not present

## 2021-02-06 DIAGNOSIS — M25559 Pain in unspecified hip: Secondary | ICD-10-CM | POA: Diagnosis not present

## 2021-02-06 DIAGNOSIS — M4807 Spinal stenosis, lumbosacral region: Secondary | ICD-10-CM | POA: Diagnosis not present

## 2021-02-06 DIAGNOSIS — M4712 Other spondylosis with myelopathy, cervical region: Secondary | ICD-10-CM

## 2021-02-06 DIAGNOSIS — M51369 Other intervertebral disc degeneration, lumbar region without mention of lumbar back pain or lower extremity pain: Secondary | ICD-10-CM

## 2021-02-06 NOTE — Progress Notes (Signed)
Office Visit Note   Patient: Austin Savage           Date of Birth: June 06, 1956           MRN: [id number] Visit Date: 02/06/2021              Requested by: Arlina Robes, MD [address] [place],  [place] [address] PCP: Arlina Robes, MD   Assessment & Plan: Visit Diagnoses:  1. Hip pain   2. Spinal stenosis of lumbosacral region   3. Degenerative disc disease, lumbar   4. Other spondylosis with radiculopathy, lumbar region   5. Other spondylosis with myelopathy, cervical region     Plan:  Plan:Arthritis meds for foot tendonitis help but due to blood thinner you can not use them. Arthritis strength tylenol. Use transdermal CBD oil or voltaren gel . Heel cord stretching exercises and use of a good arch support. Speak with your primary care MD or Cardiologist about the use of voltaren gel and whether they approve of its use. Water massage at the end of the day.   Avoid frequent bending and stooping No lifting greater than 10 lbs. May use ice or moist heat for pain. Weight loss is of benefit. Best medication for lumbar disc disease is arthritis medications that you can not take. Pool program and stretching exercises with a program of yoga or general mobilization of the joints and stretching of the muscles and tendons.    Follow-Up Instructions: Return in about 3 months (around 05/09/2021).   Orders:  Orders Placed This Encounter  Procedures   XR HIPS BILAT W OR W/O PELVIS 3-4 VIEWS   No orders of the defined types were placed in this encounter.     Procedures: No procedures performed   Clinical Data: No additional findings.   Subjective: Chief Complaint  Patient presents with   Neck - Follow-up   Lower Back - Follow-up    65 year old male with history of cervical and lumbar spondylosis with mild to moderate foramenal stenosisi. He is doing so so with both these areas. No bowel or bladder difficulty. He has difficulty with stairs and experiences  pain into the legs with long walking and standing for prolong periods. He is able to do yard work. Standing and moving he feel pain into the back and radiation in to the right hip. Has to do some stretching before he can reach his shoes and socks. The is no numbness and tingling but does get itching sensation into the left arm and hand. He gets a cramp intermittantly that will go into the left leg down in to the left foot.   Review of Systems  Constitutional: Negative.   HENT: Negative.    Eyes: Negative.   Respiratory: Negative.    Cardiovascular: Negative.   Gastrointestinal: Negative.   Endocrine: Negative.   Genitourinary: Negative.   Musculoskeletal: Negative.   Skin: Negative.   Allergic/Immunologic: Negative.   Neurological: Negative.   Hematological: Negative.   Psychiatric/Behavioral: Negative.      Objective: Vital Signs: BP (!) 144/79 (BP Location: Left Arm, Patient Position: Sitting)   Pulse 80   Ht 5' 8.5" (1.74 m)   Wt 193 lb (87.5 kg)   BMI 28.92 kg/m   Physical Exam Constitutional:      Appearance: He is well-developed.  HENT:     Head: Normocephalic and atraumatic.  Eyes:     Pupils: Pupils are equal, round, and reactive to light.  Pulmonary:  Effort: Pulmonary effort is normal.     Breath sounds: Normal breath sounds.  Abdominal:     General: Bowel sounds are normal.     Palpations: Abdomen is soft.  Musculoskeletal:     Cervical back: Normal range of motion and neck supple.     Lumbar back: Negative right straight leg raise test and negative left straight leg raise test.  Skin:    General: Skin is warm and dry.  Neurological:     Mental Status: He is alert and oriented to person, place, and time.  Psychiatric:        Behavior: Behavior normal.        Thought Content: Thought content normal.        Judgment: Judgment normal.   Back Exam   Tenderness  The patient is experiencing tenderness in the lumbar and cervical.  Range of Motion   Extension:  abnormal  Flexion:  abnormal  Lateral bend right:  abnormal  Lateral bend left:  abnormal  Rotation right:  abnormal  Rotation left:  abnormal   Tests  Straight leg raise right: negative Straight leg raise left: negative  Other  Toe walk: normal Heel walk: normal Sensation: normal Gait: normal  Erythema: no back redness Scars: absent    Specialty Comments:  No specialty comments available.  Imaging: No results found.   PMFS History: Patient Active Problem List   Diagnosis Date Noted   Carpal tunnel syndrome of right wrist 10/29/2013    Priority: High    Class: Chronic   De Quervain's tenosynovitis, right 10/29/2013    Priority: High    Class: Chronic   COPD with asthma (HCC) 06/09/2020   Allergic rhinitis 11/27/2019   Pulmonary embolism (HCC) 09/26/2019   Dyspnea 09/26/2019   Past Medical History:  Diagnosis Date   Arthritis    Diabetes mellitus without complication (HCC)    Neuropathy    Seasonal allergies    Wears glasses    reading    History reviewed. No pertinent family history.  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

## 2021-02-06 NOTE — Patient Instructions (Signed)
  Plan:Arthritis meds for foot tendonitis help but due to blood thinner you can not use them. Arthritis strength tylenol. Use transdermal CBD oil or voltaren gel . Heel cord stretching exercises and use of a good arch support. Speak with your primary care MD or Cardiologist about the use of voltaren gel and whether they approve of its use. Water massage at the end of the day.   Avoid frequent bending and stooping No lifting greater than 10 lbs. May use ice or moist heat for pain. Weight loss is of benefit. Best medication for lumbar disc disease is arthritis medications that you can not take. Pool program and stretching exercises with a program of yoga or general mobilization of the joints and stretching of the muscles and tendons.    Follow-Up Instructions: Return in about 3 months (around 05/10/19

## 2021-02-25 ENCOUNTER — Other Ambulatory Visit: Payer: Self-pay | Admitting: Primary Care

## 2021-02-27 ENCOUNTER — Other Ambulatory Visit: Payer: Self-pay | Admitting: Emergency Medicine

## 2021-02-27 ENCOUNTER — Telehealth: Payer: Self-pay | Admitting: Emergency Medicine

## 2021-02-27 MED ORDER — SPIRIVA RESPIMAT 2.5 MCG/ACT IN AERS: 2.0000 | INHALATION_SPRAY | Freq: Every day | RESPIRATORY_TRACT | 2 refills | Status: DC

## 2021-02-27 NOTE — Telephone Encounter (Signed)
Call made to patient, confirmed DOB. Confirmed medication and pharmacy. Refill sent. Voiced understanding.   Nothing further needed at this time.  

## 2021-03-16 ENCOUNTER — Other Ambulatory Visit: Payer: Self-pay | Admitting: Emergency Medicine

## 2021-03-30 ENCOUNTER — Other Ambulatory Visit: Payer: Self-pay | Admitting: Radiology

## 2021-03-30 MED ORDER — DICLOFENAC SODIUM 1 % EX GEL: 2.0000 g | Freq: Four times a day (QID) | CUTANEOUS | 3 refills | Status: AC

## 2021-04-02 ENCOUNTER — Encounter: Payer: Self-pay | Admitting: Emergency Medicine

## 2021-04-02 ENCOUNTER — Other Ambulatory Visit: Payer: Self-pay

## 2021-04-02 ENCOUNTER — Ambulatory Visit: Payer: Medicare PPO | Admitting: Emergency Medicine

## 2021-04-02 DIAGNOSIS — J301 Allergic rhinitis due to pollen: Secondary | ICD-10-CM | POA: Diagnosis not present

## 2021-04-02 DIAGNOSIS — I2699 Other pulmonary embolism without acute cor pulmonale: Secondary | ICD-10-CM | POA: Diagnosis not present

## 2021-04-02 DIAGNOSIS — J449 Chronic obstructive pulmonary disease, unspecified: Secondary | ICD-10-CM | POA: Diagnosis not present

## 2021-04-02 NOTE — Patient Instructions (Addendum)
Please continue your Eliquis as you have been taking it. Continue Spiriva once daily. Keep your albuterol available use 2 puffs if needed shortness of breath, chest tightness, wheezing. Continue your fluticasone nasal spray, 2 sprays each nostril every evening. Try starting loratadine 10 mg once daily. Follow with Dr. Delton Coombes in 12 months or sooner if you have any problems.

## 2021-04-02 NOTE — Progress Notes (Signed)
   Subjective:    Patient ID: Austin Savage, male    DOB: [birth year], 65 y.o.   MRN: [id number]  HPI 65 year old former minimal smoker (2 pk/yrs) with a history of diabetes, hypertension, allergic rhinitis, lumbar DDD.   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  He has mixed obstruction and restriction on pulmonary function testing, intact RV function on echocardiogram done 11/29/2019.  Hyper coag panel negative including the prothrombin gene 12/14/2019.  He was seen 1 month ago with slowly progressive dyspnea and decreased exertional tolerance.  He is on Spiriva for his mixed obstruction and restriction.  CBC, BNP, D-dimer reassuring on 05/09/2020.  He did have evidence for progressive renal insufficiency.  Chest x-ray 05/09/2020 reviewed by me was normal. He reports that his breathing is a bit better, is on Spiriva respimat - he does believe that he benefits some from this. Remains on an ACE-I. Uses flonase, still has some nasal gtt, throat wheeze. He doesn't have an albuterol inhaler.   ROV 04/02/21 --Austin Savage is a 1, history of minimal tobacco use, diabetes, hypertension and allergic rhinitis.  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  We continued him on Eliquis 2.5 mg twice daily for prevention.  He has mixed obstruction and restriction on PFT.  On Spiriva for COPD, believes that it is helpful. He can still get some exertional SOB w house work or yard work. Has albuterol available, uses a few times a week. No flares. No evidence bleeding.  Also dealing with allergic rhinitis, taking flonase mainly at night - helps but still has congestion during the day.    Review of Systems As above     Objective:   Physical Exam Vitals:   04/02/21 1009  BP: 132/70  Pulse: 86  Temp: 98.3 F (36.8 C)  TempSrc: Oral  SpO2: 96%  Weight: 187 lb 9.6 oz (85.1 kg)   Height: 5\' 8"  (1.727 m)   Gen: Pleasant, obese man, in no distress,  normal affect  ENT: No lesions,  mouth clear,  oropharynx clear, no postnasal drip  Neck: No JVD, no stridor  Lungs: No use of accessory muscles, no crackles or wheezing on normal respiration, no wheeze on forced expiration  Cardiovascular: RRR, heart sounds normal, no murmur or gallops, no peripheral edema  Musculoskeletal: No deformities, no cyanosis or clubbing  Neuro: alert, awake, non focal  Skin: Warm, no lesions or rash      Assessment & Plan:  COPD with asthma (HCC) Benefiting from Spiriva.  Does use albuterol intermittently throughout the week, not every day.  Functional capacity good.  No exacerbations.  Plan to continue same regimen  Allergic rhinitis Some breakthrough symptoms especially in the daytime.  He uses fluticasone principally at night and it is helpful.  We will add loratadine 10 mg daily  Pulmonary embolism (HCC) No evidence of bleeding, tolerating Eliquis without difficulty.  Plan has been to continue 2.5 mg twice daily for prevention of recurrence.  Answered his questions today regarding medication interactions with cranberry juice, green tea (lowers platelets).   , MD, PhD 04/02/2021, 10:41 AM [facility] [phone] or if no answer [phone]

## 2021-04-02 NOTE — Assessment & Plan Note (Signed)
Benefiting from Spiriva.  Does use albuterol intermittently throughout the week, not every day.  Functional capacity good.  No exacerbations.  Plan to continue same regimen

## 2021-04-02 NOTE — Assessment & Plan Note (Signed)
Some breakthrough symptoms especially in the daytime.  He uses fluticasone principally at night and it is helpful.  We will add loratadine 10 mg daily

## 2021-04-02 NOTE — Assessment & Plan Note (Signed)
No evidence of bleeding, tolerating Eliquis without difficulty.  Plan has been to continue 2.5 mg twice daily for prevention of recurrence.  Answered his questions today regarding medication interactions with cranberry juice, green tea (lowers platelets).

## 2021-05-25 ENCOUNTER — Other Ambulatory Visit: Payer: Self-pay | Admitting: Emergency Medicine

## 2021-05-26 ENCOUNTER — Telehealth: Payer: Self-pay | Admitting: Emergency Medicine

## 2021-05-26 MED ORDER — SPIRIVA RESPIMAT 2.5 MCG/ACT IN AERS: 2.0000 | INHALATION_SPRAY | Freq: Every day | RESPIRATORY_TRACT | 2 refills | Status: DC

## 2021-05-26 NOTE — Telephone Encounter (Signed)
Refill for the spiriva has been sent to the pharmacy.  Nothing further is needed.

## 2021-06-11 ENCOUNTER — Ambulatory Visit: Payer: Medicare PPO | Admitting: Surgery

## 2021-07-22 ENCOUNTER — Telehealth: Payer: Self-pay | Admitting: Emergency Medicine

## 2021-07-22 MED ORDER — APIXABAN 2.5 MG PO TABS: 2.5000 mg | ORAL_TABLET | Freq: Two times a day (BID) | ORAL | 5 refills | Status: DC

## 2021-07-22 NOTE — Telephone Encounter (Signed)
Rx for eliquis was sent in to patient preferred pharmacy nothing further needed.

## 2021-08-18 ENCOUNTER — Other Ambulatory Visit: Payer: Self-pay | Admitting: Specialist

## 2021-08-18 MED ORDER — GABAPENTIN 300 MG PO CAPS: ORAL_CAPSULE | ORAL | 3 refills | Status: DC

## 2021-08-18 NOTE — Telephone Encounter (Signed)
Pt called stating he needs a refill of his gabapentin rx, and would like to be notified when this has been sent in please.   [phone]

## 2021-12-18 IMAGING — US US RENAL
1 series · 14 of 25 positions shown · non-contrast
Comparison: None available.

CLINICAL DATA: Initial evaluation for stage III A chronic kidney
disease.

EXAM:
RENAL / URINARY TRACT ULTRASOUND COMPLETE

[Series 1: us renal · 0.23mm/px · 14 of 34 slices shown]
[im 1/34]
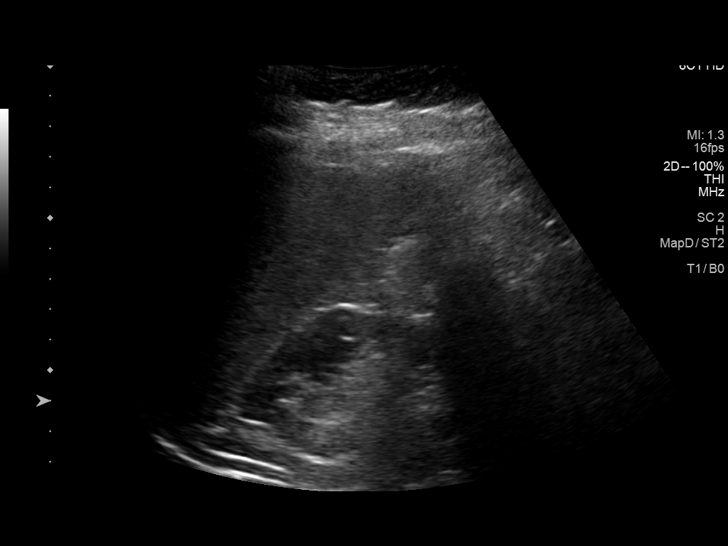
[im 3/34]
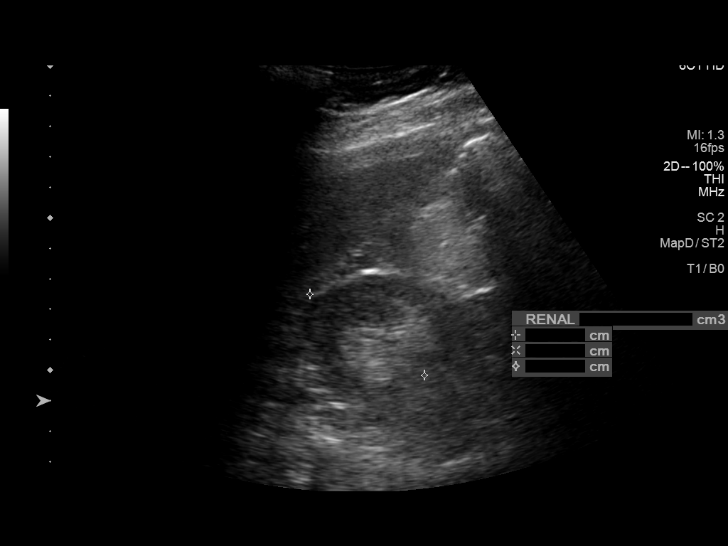
[im 6/34]
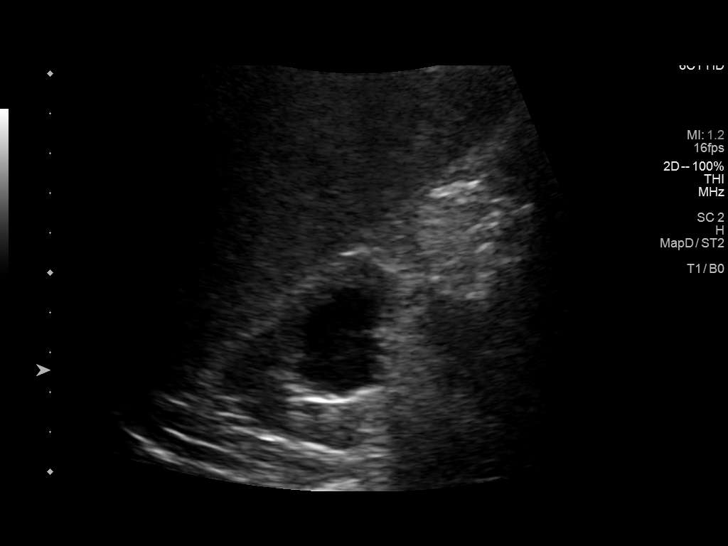
[im 9/34]
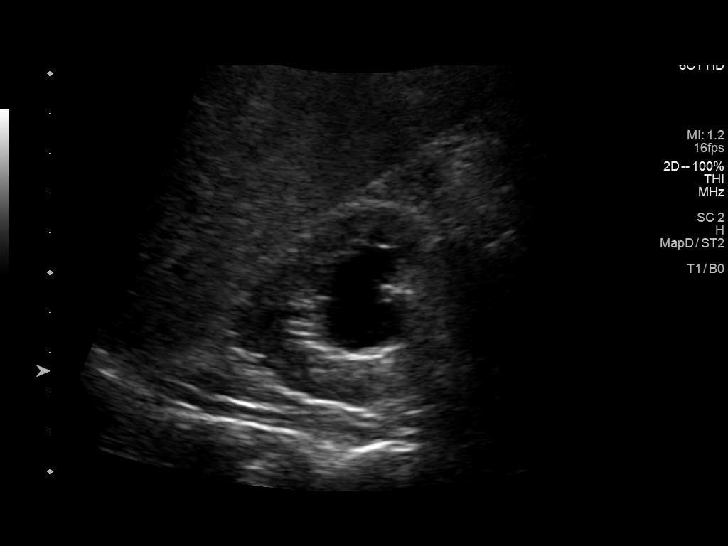
[im 12/34]
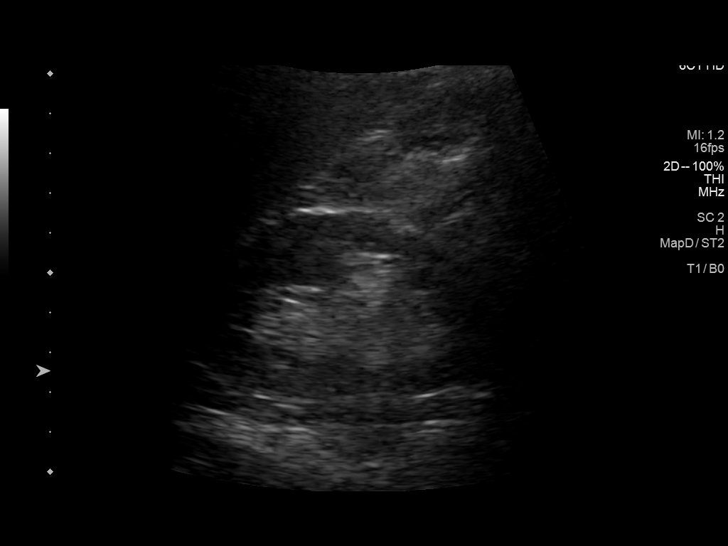
[im 13/34]
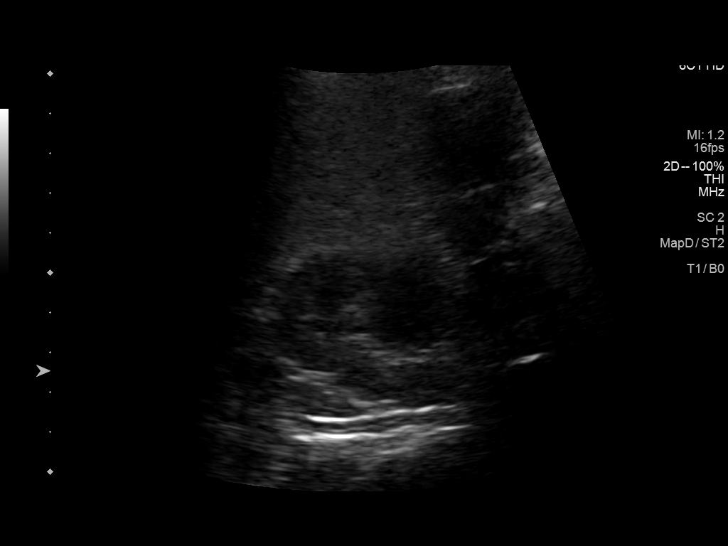
[im 16/34]
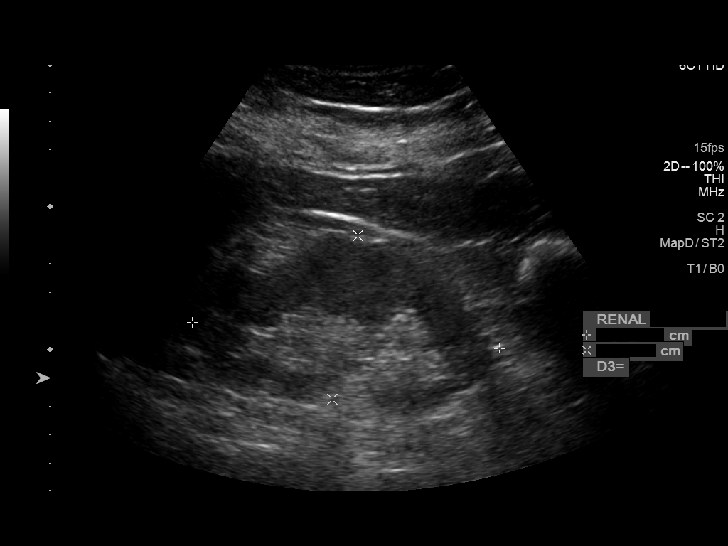
[im 18/34]
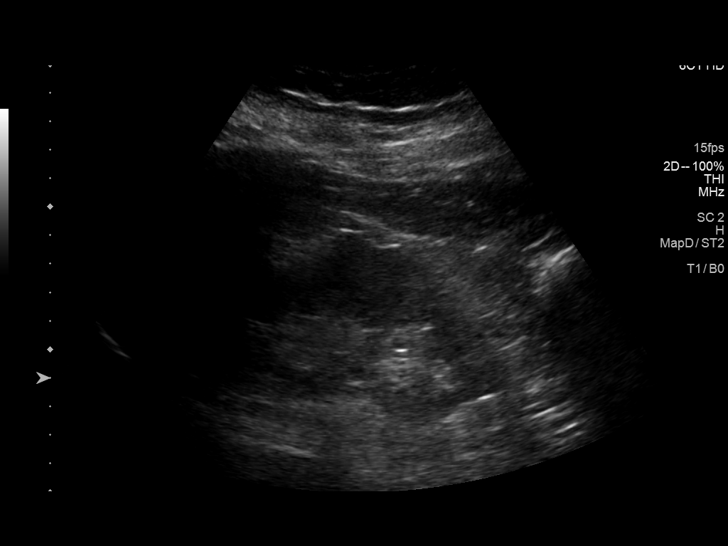
[im 21/34]
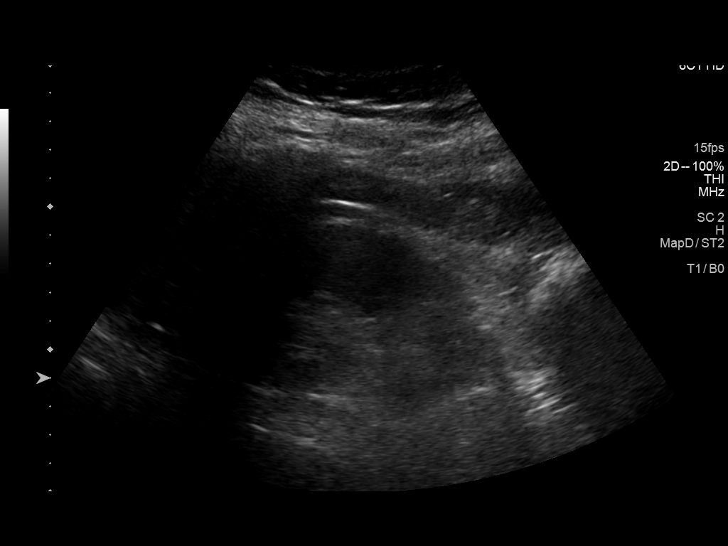
[im 23/34]
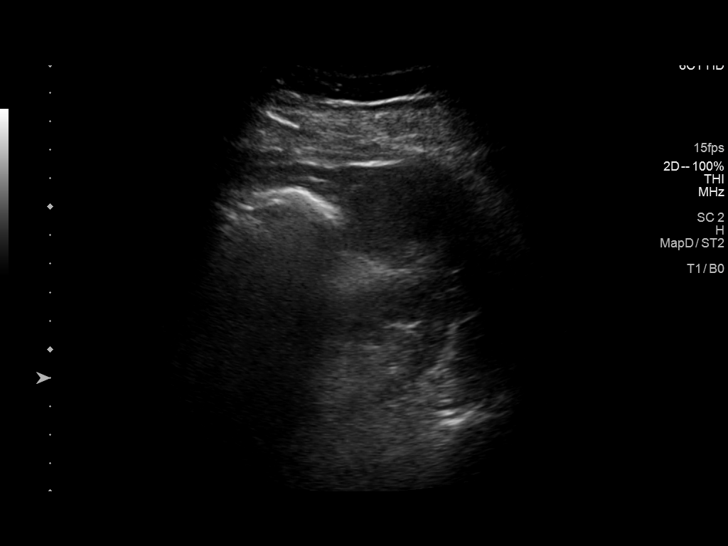
[im 25/34]
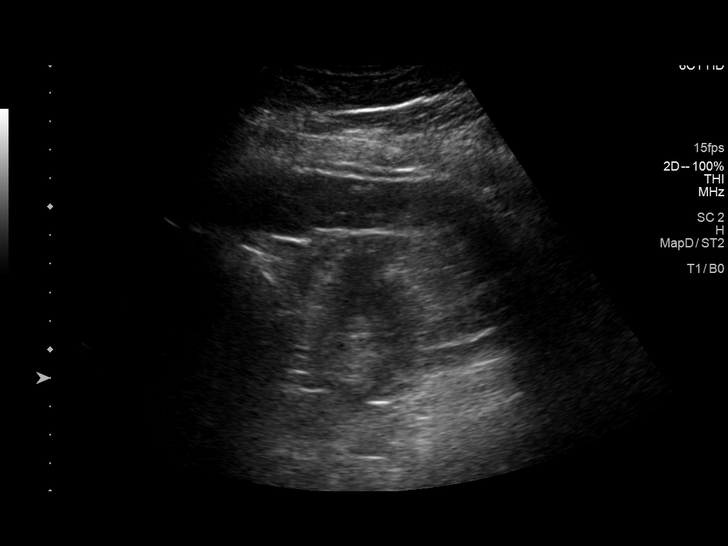
[im 28/34]
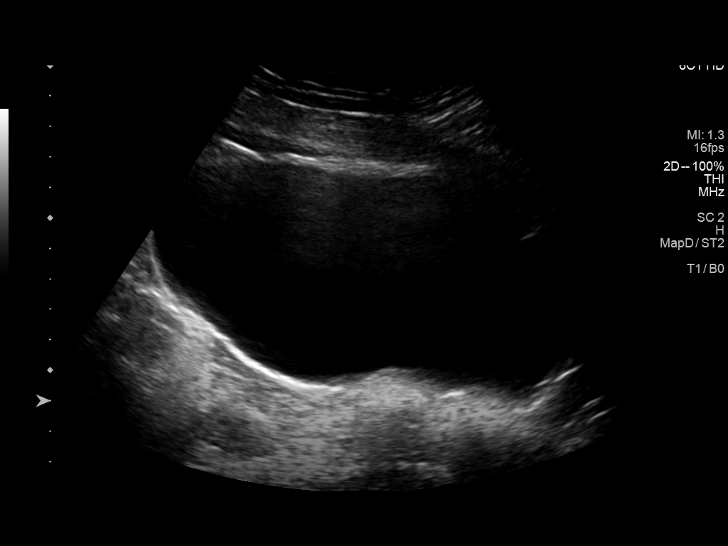
[im 31/34]
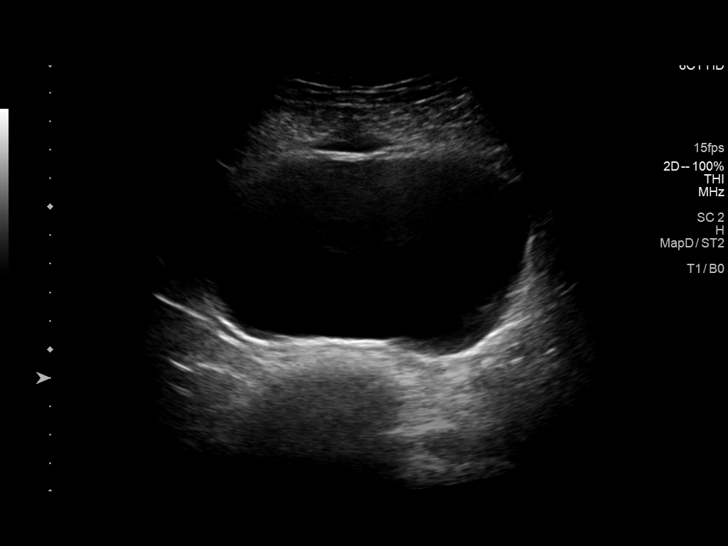
[im 34/34]
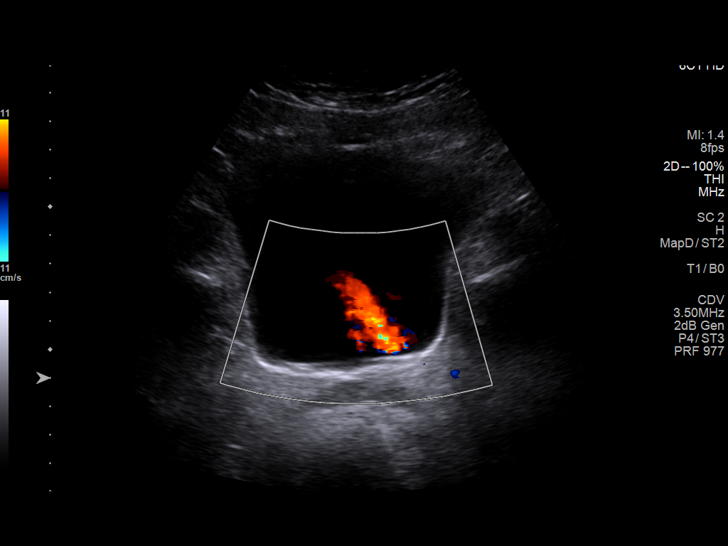

[14 of 25 positions shown; findings below may reference images not displayed]

FINDINGS: Right Kidney:

Renal measurements: 10.0 x 5.8 x 4.6 cm = volume: 138 mL. Increased
echogenicity seen within the renal parenchyma. No nephrolithiasis or
hydronephrosis. 2.4 x 2.9 x 2.3 cm mildly complex seen at the upper
pole. Cyst demonstrates few scattered low-level internal echoes
without appreciable vascularity or solid nodularity.

Left Kidney:

Renal measurements: 10.8 x 5.8 x 5.0 cm = volume: 161 mL. Increased
echogenicity seen within the renal parenchyma. No nephrolithiasis or
hydronephrosis. No focal renal mass.

Bladder:

Appears normal for degree of bladder distention.

Other:

None.
IMPRESSION: 1. Increased echogenicity within the renal parenchyma, compatible
with medical renal disease.
2. 2.9 cm mildly complex cyst at the upper pole the right kidney.
While this is likely benign, a short interval follow-up ultrasound
in 6 months is suggested to document stability.

## 2021-12-22 ENCOUNTER — Other Ambulatory Visit: Payer: Self-pay | Admitting: *Deleted

## 2021-12-22 ENCOUNTER — Other Ambulatory Visit: Payer: Self-pay

## 2021-12-22 DIAGNOSIS — I779 Disorder of arteries and arterioles, unspecified: Secondary | ICD-10-CM

## 2021-12-22 DIAGNOSIS — I739 Peripheral vascular disease, unspecified: Secondary | ICD-10-CM

## 2022-01-05 ENCOUNTER — Encounter: Payer: Self-pay | Admitting: Physician Assistant

## 2022-01-05 ENCOUNTER — Ambulatory Visit (INDEPENDENT_AMBULATORY_CARE_PROVIDER_SITE_OTHER): Payer: Medicare HMO | Admitting: Physician Assistant

## 2022-01-05 ENCOUNTER — Ambulatory Visit (HOSPITAL_COMMUNITY)
Admission: RE | Admit: 2022-01-05 | Discharge: 2022-01-05 | Disposition: A | Discharge status: Home / Self Care | Payer: Medicare HMO | Source: Ambulatory Visit | Attending: Vascular Surgery | Admitting: Vascular Surgery

## 2022-01-05 VITALS — BP 118/64 | HR 79 | Temp 98.1°F | Resp 20 | Ht 68.0 in

## 2022-01-05 DIAGNOSIS — I779 Disorder of arteries and arterioles, unspecified: Secondary | ICD-10-CM

## 2022-01-05 NOTE — Progress Notes (Signed)
[facility] ?HISTORY AND PHYSICAL  ? ?History of Present Illness:  Patient is a 66 y.o. year old male who presents for evaluation of PAD.  He was seen a year ago in our office.  He has history of right toe ulcer that was healing at the time.  Today he is here in a 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  He does have a significant heel wound that appears dry gangrene without drainage or malodor.  He has no toe ulcers and denies pain at rest.  He states his orthopedic surgeon in [place] knows about the ulcer and sates it is fine.   ? He is medically managed on Eliquis with history of PE and Statin daily.   ? ?Past Medical History:  ?Diagnosis Date  ? Arthritis   ? Diabetes mellitus without complication ([place])   ? Neuropathy   ? Seasonal allergies   ? Wears glasses   ? reading  ? ? ?Past Surgical History:  ?Procedure Laterality Date  ? CARPAL TUNNEL RELEASE Right 10/29/2013  ? Procedure: RIGHT OPEN CARPAL TUNNEL RELEASE;  Surgeon: Jessy Oto, MD;  Location: [facility];  Service: Orthopedics;  Laterality: Right;  ? DORSAL COMPARTMENT RELEASE Right 10/29/2013  ? Procedure: RIGHT DORSAL COMPARTMENT RELEASE (DEQUERVAIN);  Surgeon: Jessy Oto, MD;  Location: [facility];  Service: Orthopedics;  Laterality: Right;  ? FINGER AMPUTATION  2000  ? accidental middle right finger  ? ? ?ROS:  ? ?General:  No weight loss, Fever, chills ? ?HEENT: No recent headaches, no nasal bleeding, no visual changes, no sore throat ? ?Neurologic: No dizziness, blackouts, seizures. No recent symptoms of stroke or mini- stroke. No recent episodes of slurred speech, or temporary blindness. ? ?Cardiac: No recent episodes of chest pain/pressure, no shortness of breath at rest.  No shortness of breath with exertion.  Denies history of atrial fibrillation or irregular heartbeat ? ?Vascular: No history of rest pain in feet.  No history of claudication.  No history of non-healing ulcer, No  history of DVT  ? ?Pulmonary: No home oxygen, no productive cough, no hemoptysis,  No asthma or wheezing ? ?Musculoskeletal:  [ ]  Arthritis, [ ]  Low back pain,  [ ]  Joint pain ? ?Hematologic:No history of hypercoagulable state.  No history of easy bleeding.  No history of anemia ? ?Gastrointestinal: No hematochezia or melena,  No gastroesophageal reflux, no trouble swallowing ? ?Urinary: [ ]  chronic Kidney disease, [ ]  on HD - [ ]  MWF or [ ]  TTHS, [ ]  Burning with urination, [ ]  Frequent urination, [ ]  Difficulty urinating;  ? ?Skin: No rashes ? ?Psychological: No history of anxiety,  No history of depression ? ?Social History ?Social History  ? ?Tobacco Use  ? Smoking status: Former  ?  Packs/day: 0.25  ?  Years: 30.00  ?  Pack years: 7.50  ?  Types: Cigarettes  ?  Start date: 79  ?  Quit date: 10/24/1982  ?  Years since quitting: 39.2  ?  Passive exposure: Never  ? Smokeless tobacco: Never  ?Vaping Use  ? Vaping Use: Never used  ?Substance Use Topics  ? Alcohol use: No  ? Drug use: No  ? ? ?Family History ?No family history on file. ? ?Allergies ? ?No Known Allergies ? ? ?Current Outpatient Medications  ?Medication Sig Dispense Refill  ? amLODipine (NORVASC) 5 MG tablet Take 5 mg by mouth daily.    ?  apixaban (ELIQUIS) 2.5 MG TABS tablet Take 1 tablet (2.5 mg total) by mouth 2 (two) times daily. 60 tablet 5  ? diclofenac Sodium (VOLTAREN) 1 % GEL Apply 2 g topically 4 (four) times daily. 350 g 3  ? ferrous sulfate 325 (65 FE) MG tablet Take 325 mg by mouth 2 (two) times daily with a meal.    ? fluticasone (FLONASE) 50 MCG/ACT nasal spray Use 2 spray(s) in each nostril once daily 16 g 2  ? gabapentin (NEURONTIN) 300 MG capsule TAKE 2 CAPSULES BY MOUTH 3 TIMES DAILY 540 capsule 3  ? insulin aspart (NOVOLOG) 100 UNIT/ML FlexPen     ? insulin glargine (LANTUS) 100 UNIT/ML injection     ? irbesartan (AVAPRO) 75 MG tablet Take 75 mg by mouth daily.    ? JARDIANCE 10 MG TABS tablet     ? simvastatin (ZOCOR) 20 MG tablet  Take 20 mg by mouth daily.    ? Tiotropium Bromide Monohydrate (SPIRIVA RESPIMAT) 2.5 MCG/ACT AERS Inhale 2 puffs into the lungs daily. 4 g 2  ? VENTOLIN HFA 108 (90 Base) MCG/ACT inhaler INHALE 2 PUFFS BY MOUTH EVERY 6 HOURS AS NEEDED FOR WHEEZING FOR SHORTNESS OF BREATH 18 g 0  ? ?No current facility-administered medications for this visit.  ? ? ?Physical Examination ? ?Vitals:  ? 01/05/22 1008  ?BP: 118/64  ?Pulse: 79  ?Resp: 20  ?Temp: 98.1 ?F (36.7 ?C)  ?TempSrc: Temporal  ?SpO2: 99%  ?Height: 5\' 8"  (1.727 m)  ? ? ?Body mass index is 28.52 kg/m?. ? ?General:  Alert and oriented, no acute distress ?HEENT: Normal ?Neck: No bruit or JVD ?Pulmonary: Clear to auscultation bilaterally ?Cardiac: Regular Rate and Rhythm without murmur ?Abdomen: Soft, non-tender, non-distended, no mass, no scars ?Skin: No rash ? ?  ?Neurologic: decreased sensation with known peripheral neuropathy. ? ?DATA:  ? ?   ?ABI Findings:  ?+--------+------------------+-----+--------+--------+  ?Right   Rt Pressure (mmHg)IndexWaveformComment   ?+--------+------------------+-----+--------+--------+  ?[phone]                                      ?+--------+------------------+-----+--------+--------+  ? ?+---------+------------------+-----+----------+-------+  ?Left     Lt Pressure (mmHg)IndexWaveform  Comment  ?+---------+------------------+-----+----------+-------+  ?Brachial 132                                       ?+---------+------------------+-----+----------+-------+  ?PTA      107               0.81 monophasic         ?+---------+------------------+-----+----------+-------+  ?DP       95                0.72 biphasic           ?+---------+------------------+-----+----------+-------+  ?Great Toe                       Absent             ?+---------+------------------+-----+----------+-------+  ? ?+-------+-----------+-----------+------------+------------+  ?ABI/TBIToday's ABIToday's TBIPrevious  ABIPrevious TBI  ?+-------+-----------+-----------+------------+------------+  ?Right  BKA        BKA        0.99        0.34          ?+-------+-----------+-----------+------------+------------+  ?Left   0.81  absent     0.79        0.26          ?+-------+-----------+-----------+------------+------------+  ? ?  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  ? ? 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 Due to non healing wounds ?Today he reports with a large left heel wound.  His ABI's show an index of 0.81 with absent TBI.  He has no evidence of toe wounds.   ? I suggested angiogram with left LE run off and possible intervention  He did not want to commit.  He stated he wanted to think about it and call us back.  He repeatedly states his Orthopedic physician knows about it and states it is fine.   ? He will continue with medical management for now. ? ? ? ? ?Roxy Horseman ?PA-C ?[facility] [phone] ? ?MD in clinic [place] ?

## 2022-01-13 ENCOUNTER — Telehealth: Payer: Self-pay | Admitting: Emergency Medicine

## 2022-01-13 ENCOUNTER — Other Ambulatory Visit: Payer: Self-pay | Admitting: *Deleted

## 2022-01-13 DIAGNOSIS — I779 Disorder of arteries and arterioles, unspecified: Secondary | ICD-10-CM

## 2022-01-13 MED ORDER — APIXABAN 2.5 MG PO TABS: 2.5000 mg | ORAL_TABLET | Freq: Two times a day (BID) | ORAL | 5 refills | Status: DC

## 2022-01-13 NOTE — Telephone Encounter (Signed)
Refill of pt's Eliquis has been sent to preferred pharmacy for pt. Called and spoke with pt letting him know this had been done and he verbalized understanding. Nothing further needed.

## 2022-02-18 ENCOUNTER — Other Ambulatory Visit: Payer: Self-pay | Admitting: Specialist

## 2022-03-24 ENCOUNTER — Ambulatory Visit: Payer: Medicare HMO | Admitting: Surgery

## 2022-06-28 IMAGING — US US RENAL
1 series · 14 of 25 positions shown · non-contrast
Comparison: Renal ultrasound 06/09/2020 renal ultrasound 06/09/2020

CLINICAL DATA: Chronic kidney disease stage 3 a. Diabetic and
hypertension

EXAM:
RENAL / URINARY TRACT ULTRASOUND COMPLETE

[Series 1: us renal · 0.23mm/px · 14 of 48 slices shown]
[im 1/48]
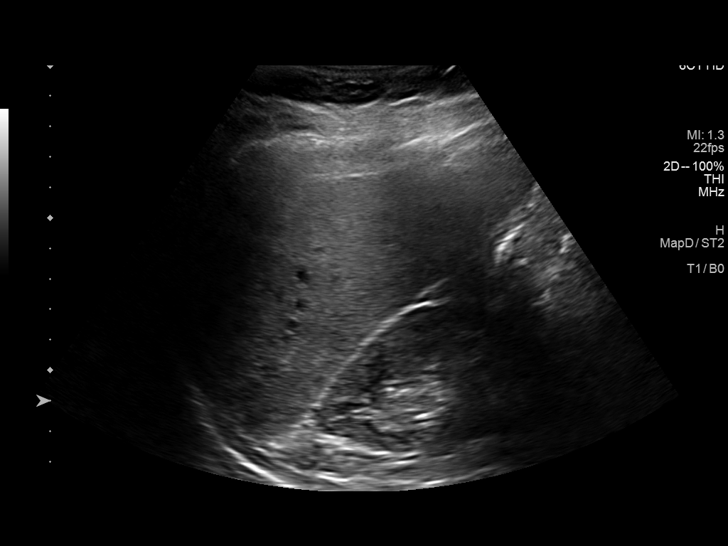
[im 4/48]
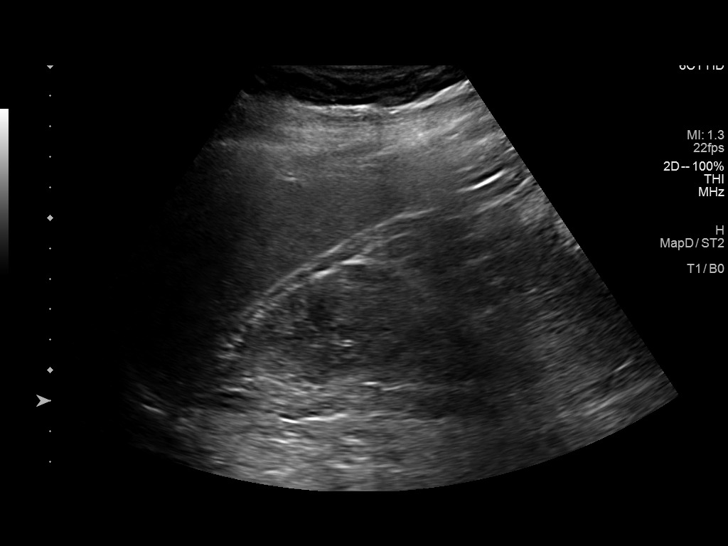
[im 8/48]
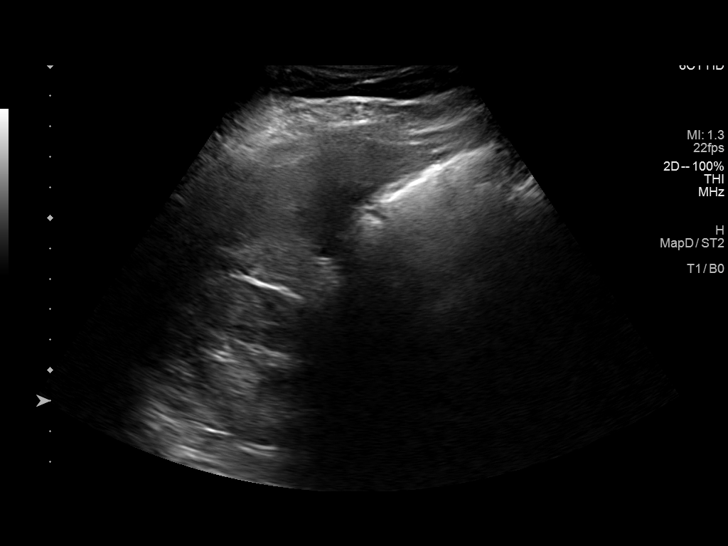
[im 12/48]
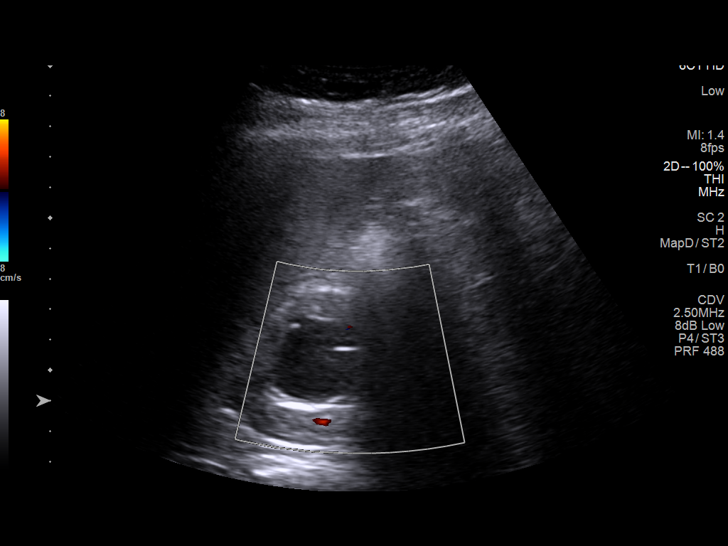
[im 16/48]
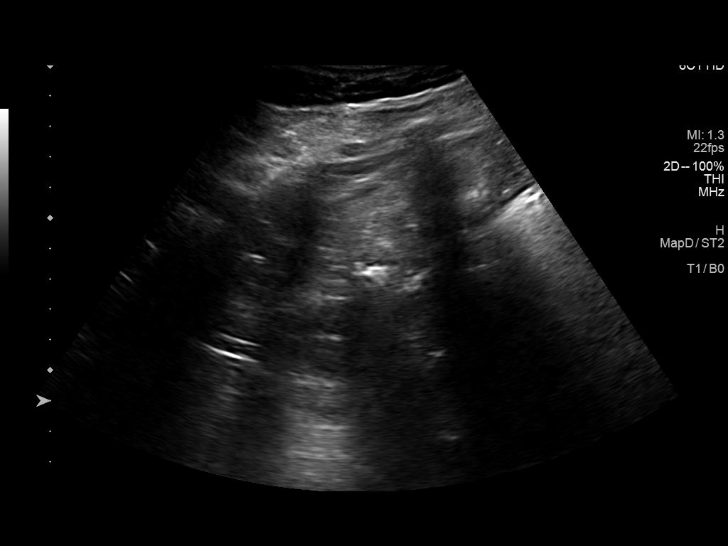
[im 18/48]
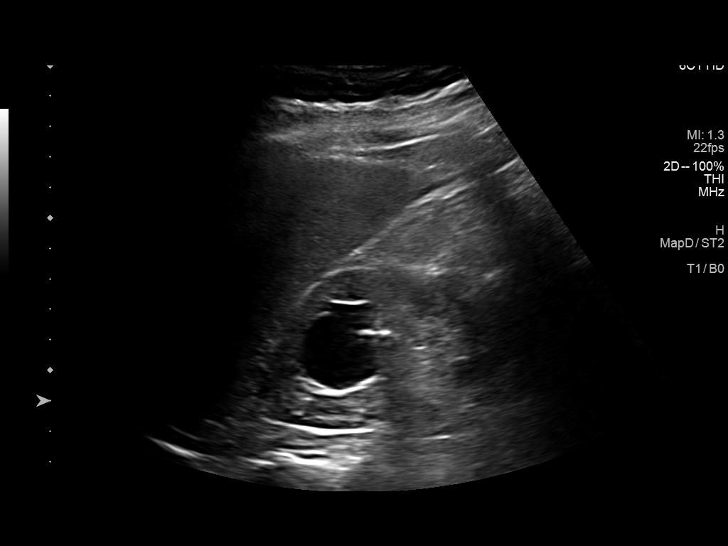
[im 22/48]
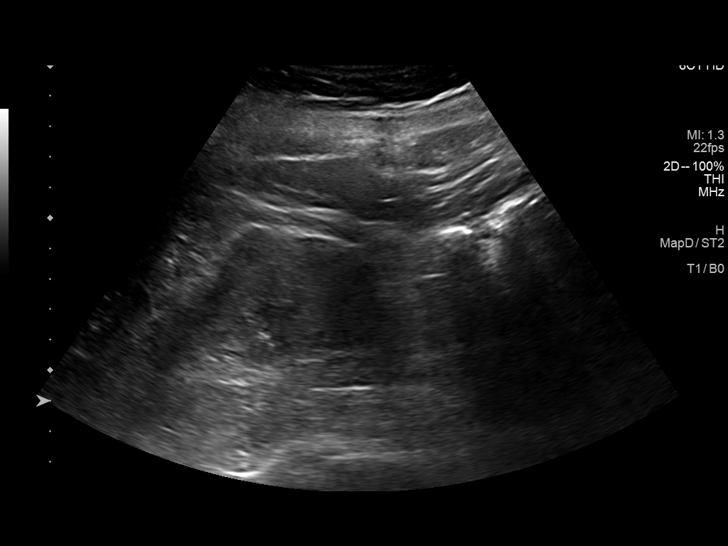
[im 26/48]
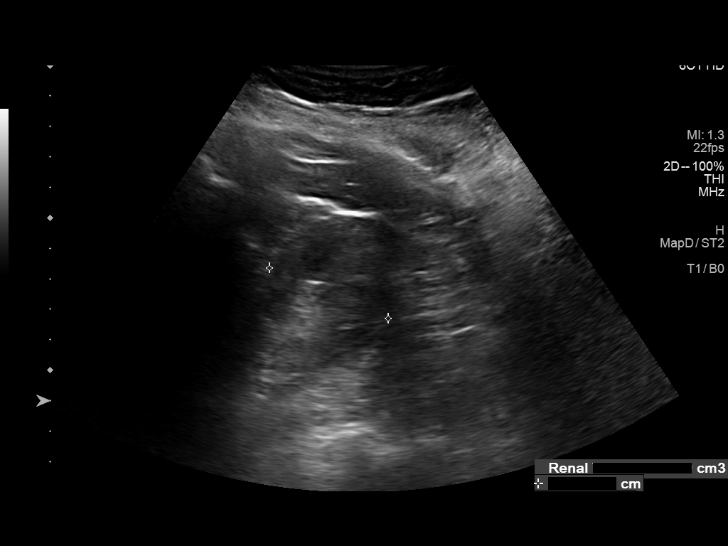
[im 30/48]
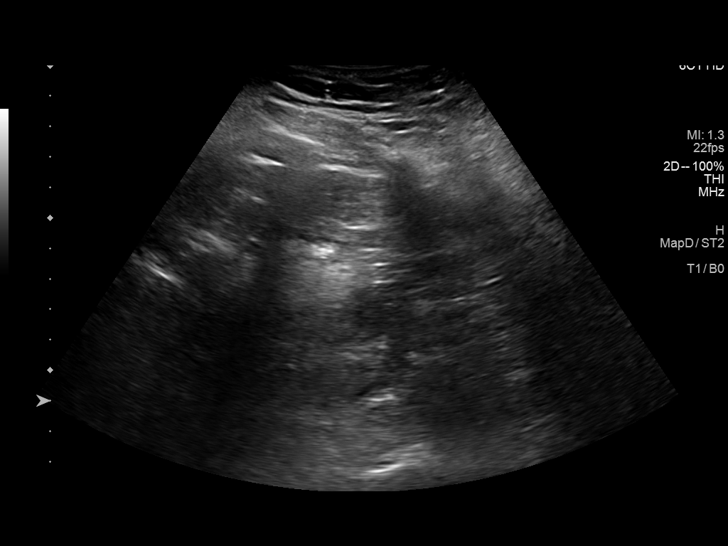
[im 32/48]
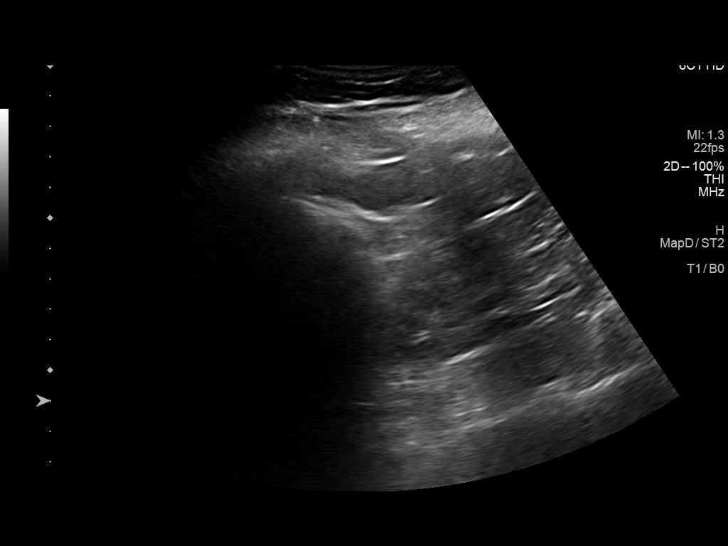
[im 36/48]
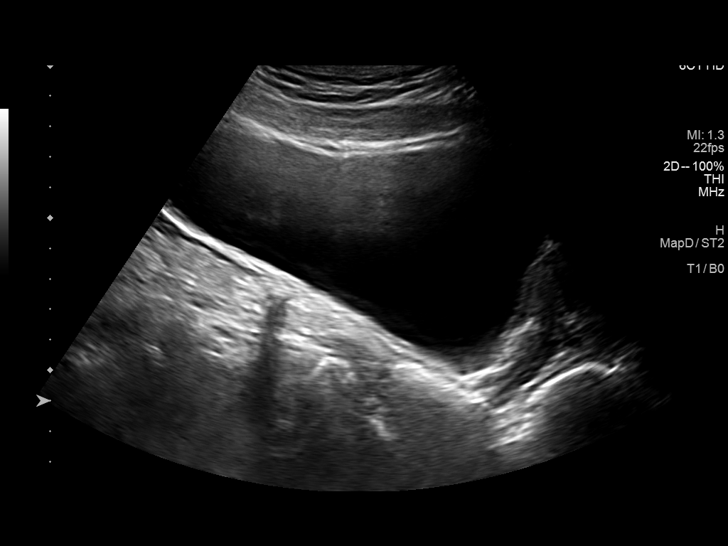
[im 40/48]
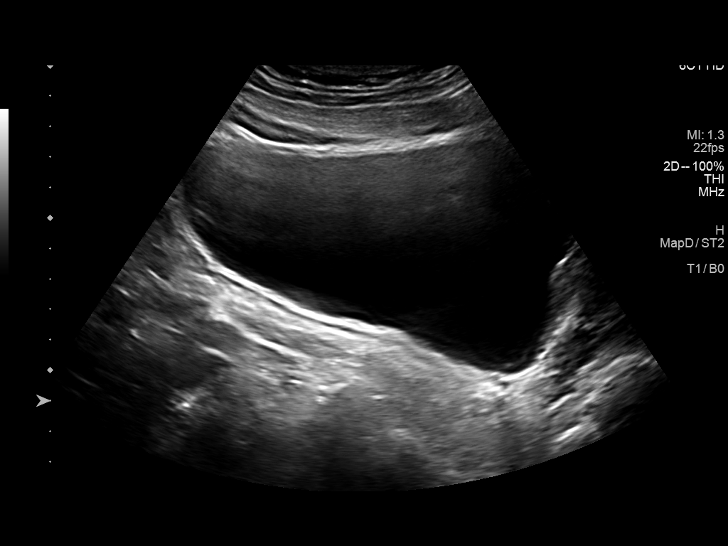
[im 44/48]
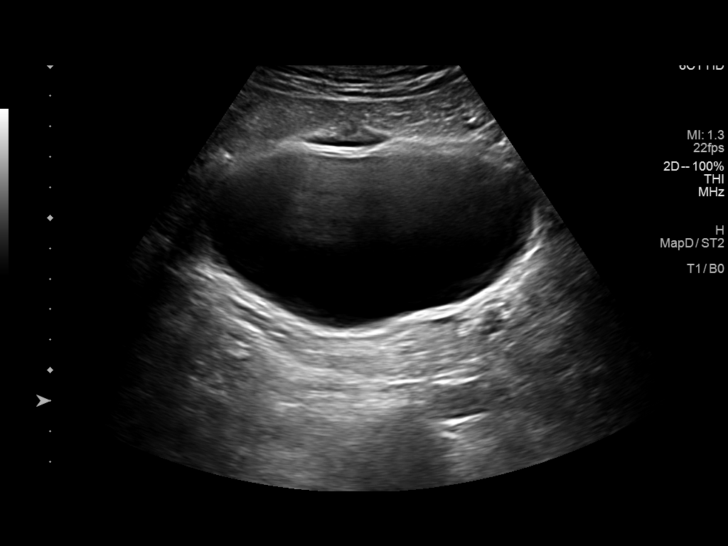
[im 48/48]
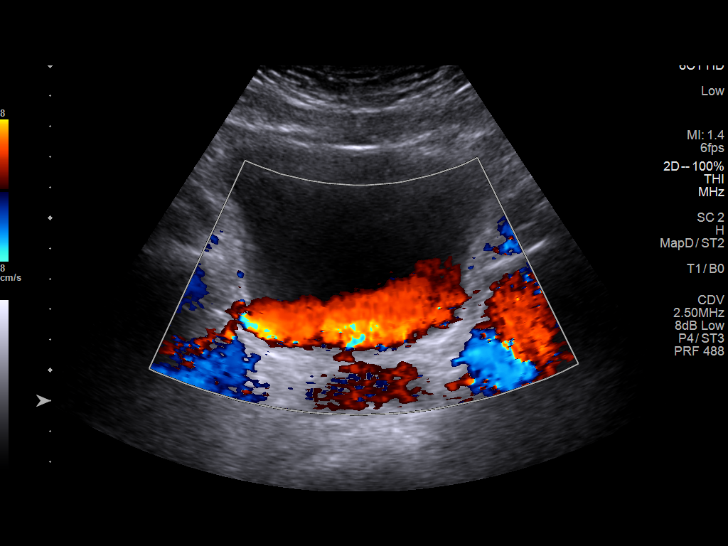

[14 of 25 positions shown; findings below may reference images not displayed]

FINDINGS: Right Kidney:

Renal measurements: 10.4 x 4.7 x 4.8 cm = volume: 123 mL. Right
upper pole complex cyst unchanged measuring 2.6 x 2.9 x 2.3 cm.
Internal septation and slight internal echogenicity without soft
tissue mass. Echogenicity within normal limits. No mass or
hydronephrosis visualized.

Left Kidney:

Renal measurements: 10.5 x 6.1 x 4.2 cm = volume: 142 mL.
Echogenicity within normal limits. No mass or hydronephrosis
visualized.

Bladder:

Appears normal for degree of bladder distention.

Other:

None.
IMPRESSION: Stable renal ultrasound.  No obstruction.

Complex cyst right upper pole unchanged.

## 2022-07-26 ENCOUNTER — Other Ambulatory Visit: Payer: Self-pay | Admitting: Emergency Medicine

## 2022-09-15 ENCOUNTER — Encounter: Payer: Self-pay | Admitting: Adult Health

## 2022-09-15 ENCOUNTER — Ambulatory Visit (INDEPENDENT_AMBULATORY_CARE_PROVIDER_SITE_OTHER): Payer: Medicare HMO | Admitting: Adult Health

## 2022-09-15 VITALS — BP 124/62 | HR 83 | Temp 98.1°F | Ht 68.0 in | Wt 168.2 lb

## 2022-09-15 DIAGNOSIS — I2699 Other pulmonary embolism without acute cor pulmonale: Secondary | ICD-10-CM

## 2022-09-15 DIAGNOSIS — J4489 Other specified chronic obstructive pulmonary disease: Secondary | ICD-10-CM

## 2022-09-15 DIAGNOSIS — J301 Allergic rhinitis due to pollen: Secondary | ICD-10-CM

## 2022-09-15 MED ORDER — ALBUTEROL SULFATE HFA 108 (90 BASE) MCG/ACT IN AERS: 1.0000 | INHALATION_SPRAY | RESPIRATORY_TRACT | 3 refills | Status: AC | PRN

## 2022-09-15 MED ORDER — APIXABAN 2.5 MG PO TABS: 2.5000 mg | ORAL_TABLET | Freq: Two times a day (BID) | ORAL | 5 refills | Status: DC

## 2022-09-15 MED ORDER — SPIRIVA RESPIMAT 2.5 MCG/ACT IN AERS: 2.0000 | INHALATION_SPRAY | Freq: Every day | RESPIRATORY_TRACT | 5 refills | Status: AC

## 2022-09-15 NOTE — Assessment & Plan Note (Signed)
Appears compensated without flare symptoms.  Continue with Spiriva daily.  Albuterol as needed.  Plan  Patient Instructions  Continue on Eliquis Avoid any nonsteroidal medicine such as ibuprofen, Motrin, Advil Continue on Spiriva daily Albuterol inhaler as needed Activity as tolerated. Claritin and Flonase daily as needed Recommend RSV vaccine  Saline nasal spray Twice daily   Saline nasal gel At bedtime   Follow-up with Dr. Lamonte Sakai in 6 months and as needed

## 2022-09-15 NOTE — Patient Instructions (Addendum)
Continue on Eliquis Avoid any nonsteroidal medicine such as ibuprofen, Motrin, Advil Continue on Spiriva daily Albuterol inhaler as needed Activity as tolerated. Claritin and Flonase daily as needed Recommend RSV vaccine  Saline nasal spray Twice daily   Saline nasal gel At bedtime   Follow-up with Dr. Lamonte Sakai in 6 months and as needed

## 2022-09-15 NOTE — Assessment & Plan Note (Signed)
Flonase and Claritin as needed.  Saline spray and saline nasal gel.

## 2022-09-15 NOTE — Progress Notes (Signed)
@Patient  ID: Austin Savage, male    DOB: 29-Nov-1955, 67 y.o.   MRN: [id number]  Chief Complaint  Patient presents with   Follow-up    Referring provider: Andres Shad, *  HPI: 67 year old male with minimal smoking history followed for history of unprovoked bilateral PE on lifelong anticoagulation therapy (low-dose Eliquis) and COPD, allergic rhinitis Medical history significant for diabetes.  TEST/EVENTS :  Venous Dopplers negative    Antithrombin III activity normal, beta 2 glycoprotein antibodies negative, homocystine normal, protein C amount and activity normal, protein S amount and activity normal, factor V Leiden mutation negative, anticardiolipin antibodies negative.  Lupus anticoagulant negative.  His prothrombin gene negative   PFT done today, , show mixed obstruction and restriction,  FEV1 66 to 70% predicted.  Normal volumes, normal diffusion capacity.   Echocardiogram was done on 11/29/2019 and I have reviewed the results, shows intact LV function 60-65%, normal RV function and size without any TR.  PASP not estimated   09/15/2022 Follow up: PE, COPD, chronic rhinitis Patient presents for a follow-up visit.  Last seen August 2022.  Patient has underlying COPD with asthma.  He remains on Spiriva daily.  He also has a history of unprovoked PE on lifelong anticoagulation with Eliquis at low-dose.  Patient says he is doing well and endorses compliance with Eliquis.  He denies any known bleeding.  Says overall breathing is doing okay.  Denies any flare of cough or wheezing.  No increased albuterol use.  Does take Claritin and Flonase as needed.  No recent flare of allergies. Needs refill of medication. Ran out last week.   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 Has prosthesis bilaterally . Working on walking with PT. . Says no pain now.  Care at [facility] .    No Known Allergies  Immunization History   Administered Date(s) Administered   Influenza Inj Mdck Quad With Preservative 06/15/2018   Influenza, High Dose Seasonal PF 05/24/2019   Moderna Sars-Covid-2 Vaccination 11/05/2019, 12/04/2019   Pneumococcal Conjugate-13 06/07/2017    Past Medical History:  Diagnosis Date   Arthritis    Diabetes mellitus without complication ([place])    Neuropathy    Seasonal allergies    Wears glasses    reading    Tobacco History: Social History   Tobacco Use  Smoking Status Former   Packs/day: 0.25   Years: 30.00   Total pack years: 7.50   Types: Cigarettes   Start date: 37   Quit date: 10/24/1982   Years since quitting: 39.9   Passive exposure: Never  Smokeless Tobacco Never   Counseling given: Not Answered   Outpatient Medications Prior to Visit  Medication Sig Dispense Refill   amLODipine (NORVASC) 5 MG tablet Take 5 mg by mouth daily.     diclofenac Sodium (VOLTAREN) 1 % GEL Apply 2 g topically 4 (four) times daily. 350 g 3   ferrous sulfate 325 (65 FE) MG tablet Take 325 mg by mouth 2 (two) times daily with a meal.     fluticasone (FLONASE) 50 MCG/ACT nasal spray Use 2 spray(s) in each nostril once daily 16 g 2   gabapentin (NEURONTIN) 300 MG capsule TAKE 2 CAPSULES BY MOUTH THREE TIMES DAILY 540 capsule 0   insulin aspart (NOVOLOG) 100 UNIT/ML FlexPen      insulin glargine (LANTUS) 100 UNIT/ML injection      irbesartan (AVAPRO) 75 MG tablet Take 75 mg by mouth daily.  JARDIANCE 10 MG TABS tablet      simvastatin (ZOCOR) 20 MG tablet Take 20 mg by mouth daily.     ELIQUIS 2.5 MG TABS tablet Take 1 tablet by mouth twice daily 60 tablet 0   Tiotropium Bromide Monohydrate (SPIRIVA RESPIMAT) 2.5 MCG/ACT AERS Inhale 2 puffs into the lungs daily. 4 g 2   VENTOLIN HFA 108 (90 Base) MCG/ACT inhaler INHALE 2 PUFFS BY MOUTH EVERY 6 HOURS AS NEEDED FOR WHEEZING FOR SHORTNESS OF BREATH 18 g 0   No facility-administered medications prior to visit.     Review of Systems:    Constitutional:   No  weight loss, night sweats,  Fevers, chills, fatigue, or  lassitude.  HEENT:   No headaches,  Difficulty swallowing,  Tooth/dental problems, or  Sore throat,                No sneezing, itching, ear ache,  +nasal congestion, post nasal drip,   CV:  No chest pain,  Orthopnea, PND, swelling in lower extremities, anasarca, dizziness, palpitations, syncope.   GI  No heartburn, indigestion, abdominal pain, nausea, vomiting, diarrhea, change in bowel habits, loss of appetite, bloody stools.   Resp: No shortness of breath with exertion or at rest.  No excess mucus, no productive cough,  No non-productive cough,  No coughing up of blood.  No change in color of mucus.  No wheezing.  No chest wall deformity  Skin: no rash or lesions.  GU: no dysuria, change in color of urine, no urgency or frequency.  No flank pain, no hematuria   MS:  No joint pain or swelling.  No decreased range of motion.  No back pain.    Physical Exam  BP 124/62   Pulse 83   Temp 98.1 F (36.7 C) (Oral)   Ht 5\' 8"  (1.727 m)   Wt 168 lb 3.2 oz (76.3 kg)   SpO2 99%   BMI 25.57 kg/m   GEN: A/Ox3; pleasant , NAD, well nourished , wheelchair    HEENT:  [place]/AT,   NOSE-clear, THROAT-clear, no lesions, no postnasal drip or exudate noted. Poor dentition  NECK:  Supple w/ fair ROM; no JVD; normal carotid impulses w/o bruits; no thyromegaly or nodules palpated; no lymphadenopathy.    RESP  Clear  P & A; w/o, wheezes/ rales/ or rhonchi. no accessory muscle use, no dullness to percussion  CARD:  RRR, no m/r/g, no peripheral edema, pulses intact, no cyanosis or clubbing.  GI:   Soft & nt; nml bowel sounds; no organomegaly or masses detected.   Musco: Warm bil, bilateral LE prosthesis   Neuro: alert, no focal deficits noted.    Skin: Warm, no lesions or rashes    Lab Results:  CBC   Imaging: No results found.       Latest Ref Rng & Units 12/19/2019    9:53 AM  PFT Results  FVC-Pre  L 2.44   FVC-Predicted Pre % 65   FVC-Post L 2.51   FVC-Predicted Post % 67   Pre FEV1/FVC % % 79   Post FEV1/FCV % % 81   FEV1-Pre L 1.92   FEV1-Predicted Pre % 66   FEV1-Post L 2.03   DLCO uncorrected ml/min/mmHg 26.93   DLCO UNC% % 104   DLCO corrected ml/min/mmHg 26.93   DLCO COR %Predicted % 104   DLVA Predicted % 130   TLC L 8.90   TLC % Predicted % 132   RV % Predicted %  135     No results found for: "NITRICOXIDE"      Assessment & Plan:   COPD with asthma (HCC) Appears compensated without flare symptoms.  Continue with Spiriva daily.  Albuterol as needed.  Plan  Patient Instructions  Continue on Eliquis Avoid any nonsteroidal medicine such as ibuprofen, Motrin, Advil Continue on Spiriva daily Albuterol inhaler as needed Activity as tolerated. Claritin and Flonase daily as needed Recommend RSV vaccine  Saline nasal spray Twice daily   Saline nasal gel At bedtime   Follow-up with Dr. Delton Coombes in 6 months and as needed    Pulmonary embolism (HCC) History of unprovoked PE on lifelong anticoagulation with low-dose Eliquis.  Continue current dosing.  Refills given.  Seems to be tolerating Eliquis without difficulty.  Plan  Patient Instructions  Continue on Eliquis Avoid any nonsteroidal medicine such as ibuprofen, Motrin, Advil Continue on Spiriva daily Albuterol inhaler as needed Activity as tolerated. Claritin and Flonase daily as needed Recommend RSV vaccine  Saline nasal spray Twice daily   Saline nasal gel At bedtime   Follow-up with Dr. Delton Coombes in 6 months and as needed    Allergic rhinitis Flonase and Claritin as needed.  Saline spray and saline nasal gel.     Rubye Oaks, NP 09/15/2022

## 2022-09-15 NOTE — Assessment & Plan Note (Signed)
History of unprovoked PE on lifelong anticoagulation with low-dose Eliquis.  Continue current dosing.  Refills given.  Seems to be tolerating Eliquis without difficulty.  Plan  Patient Instructions  Continue on Eliquis Avoid any nonsteroidal medicine such as ibuprofen, Motrin, Advil Continue on Spiriva daily Albuterol inhaler as needed Activity as tolerated. Claritin and Flonase daily as needed Recommend RSV vaccine  Saline nasal spray Twice daily   Saline nasal gel At bedtime   Follow-up with Dr. Lamonte Sakai in 6 months and as needed

## 2022-10-13 ENCOUNTER — Ambulatory Visit (INDEPENDENT_AMBULATORY_CARE_PROVIDER_SITE_OTHER): Payer: Medicare HMO

## 2022-10-13 ENCOUNTER — Ambulatory Visit (INDEPENDENT_AMBULATORY_CARE_PROVIDER_SITE_OTHER): Payer: Medicare HMO | Admitting: Orthopedic Surgery

## 2022-10-13 ENCOUNTER — Encounter: Payer: Self-pay | Admitting: Orthopedic Surgery

## 2022-10-13 VITALS — BP 146/74 | HR 87 | Ht 68.0 in | Wt 168.5 lb

## 2022-10-13 DIAGNOSIS — M4807 Spinal stenosis, lumbosacral region: Secondary | ICD-10-CM

## 2022-10-13 DIAGNOSIS — M5136 Other intervertebral disc degeneration, lumbar region: Secondary | ICD-10-CM

## 2022-10-13 MED ORDER — GABAPENTIN 300 MG PO CAPS: ORAL_CAPSULE | ORAL | 2 refills | Status: DC

## 2022-10-13 NOTE — Progress Notes (Signed)
Orthopedic Spine Surgery Office Note  Assessment: Patient is a 67 y.o. male with periodic low back pain, chronic.  Has been stable.  Has been treated with gabapentin by my retired partner Dr. Louanne Skye.  Doing well with that treatment and interested in continuing   Plan: -Since I am taking over for my partner on this patient and he has been doing well with gabapentin, recommended continuing the gabapentin.  A new prescription was provided to him today. If surgery is ever considered as an option, would need an A1c under 7.5 -Patient should return to office in 52 weeks, x-rays at next visit: Lumbar AP/lateral/flex/ex   Patient expressed understanding of the plan and all questions were answered to the patient's satisfaction.   ___________________________________________________________________________   History:  Patient is a 67 y.o. male who presents today for lumbar spine.  Patient has a chronic history of low back pain.  He said it is gotten better with time.  He still feels it on a regular basis.  It is not an every day basis.  He has been treated with gabapentin and has been getting relief with that.  He is interested in continuing that treatment.  He is not having any pain radiating to his legs at this point.  Denies paresthesias and numbness.   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  Social history: Denies use of nicotine product (smoking,  vaping, patches, smokeless) Alcohol use: Denies Denies recreational drug use   Physical Exam:  General: no acute distress, appears stated age Neurologic: alert, answering questions appropriately, following commands Respiratory: unlabored breathing on room air, symmetric chest rise Psychiatric: appropriate affect, normal cadence to speech   MSK (spine):  -Strength exam      Left  Right EHL    -/5  -/5 TA    -/5  -/5 GSC    -/5  -/5 Knee extension  5/5  5/5 Hip flexion   5/5  5/5  Unable to test distal lower extremity function bilaterally due to amputation  -Sensory exam    Sensation intact to light touch proximal to the incision sites bilaterally in the lower extremities  -Straight leg raise: Negative bilaterally -Clonus: no beats bilaterally  -Left hip exam: No pain through range of motion, negative Stinchfield, negative FABER -Right hip exam: No pain through range of motion, negative Stinchfield, negative FABER  Imaging: XR of the lumbar spine from 10/13/2022 was independently reviewed and interpreted, showing spondylolisthesis at L4/5 diffuse about 2 mm between flexion and extension views.  No fracture or dislocation seen.  Minimal disc height loss seen at L4/5.  No other significant degenerative changes.   Patient name: Austin Savage Patient MRN: [id number] Date of visit: 10/13/22

## 2022-12-17 ENCOUNTER — Encounter: Payer: Self-pay | Admitting: Physician Assistant

## 2022-12-17 ENCOUNTER — Ambulatory Visit: Payer: Medicare HMO | Admitting: Physician Assistant

## 2022-12-17 VITALS — BP 134/67 | HR 74 | Temp 97.5°F | Resp 20 | Ht 68.0 in | Wt 154.5 lb

## 2022-12-17 DIAGNOSIS — I739 Peripheral vascular disease, unspecified: Secondary | ICD-10-CM

## 2022-12-17 DIAGNOSIS — Z89512 Acquired absence of left leg below knee: Secondary | ICD-10-CM

## 2022-12-17 DIAGNOSIS — Z89511 Acquired absence of right leg below knee: Secondary | ICD-10-CM

## 2022-12-17 NOTE — Progress Notes (Signed)
Office Note   History of Present Illness   Austin Savage is a 67 y.o. (11-06-1955) male who presents for surveillance of PAD.  He has a history of nonhealing right toe ulcer with progression, ultimately requiring right BKA in October 2022 in [place], [place].  At his last visit with our office in May 23 he had a significant left heel wound.  He was offered a left lower extremity angiogram but did not want to commit at the time.  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  He returns today for follow-up.  He recently underwent left BKA at [facility] in August last year.  He also has a history of right BKA in 2022.  Both of his amputation sites are well-healed.  He now ambulates with bilateral prosthetics without issue.  He denies any claudication in his thighs or wounds of his amputation sites.  Current Outpatient Medications  Medication Sig Dispense Refill   albuterol (VENTOLIN HFA) 108 (90 Base) MCG/ACT inhaler Inhale 1-2 puffs into the lungs every 4 (four) hours as needed for wheezing or shortness of breath. 1 each 3   amLODipine (NORVASC) 5 MG tablet Take 5 mg by mouth daily.     apixaban (ELIQUIS) 2.5 MG TABS tablet Take 1 tablet (2.5 mg total) by mouth 2 (two) times daily. 60 tablet 5   aspirin 81 MG chewable tablet CHEW AND SWALLOW 1 TABLET BY MOUTH ONCE DAILY     diclofenac Sodium (VOLTAREN) 1 % GEL Apply 2 g topically 4 (four) times daily. 350 g 3   ferrous sulfate 325 (65 FE) MG tablet Take 325 mg by mouth 2 (two) times daily with a meal.     fluticasone (FLONASE) 50 MCG/ACT nasal spray Use 2 spray(s) in each nostril once daily 16 g 2   gabapentin (NEURONTIN) 300 MG capsule TAKE 2 CAPSULES BY MOUTH THREE TIMES DAILY 180 capsule 2   insulin aspart (NOVOLOG) 100 UNIT/ML FlexPen      insulin glargine (LANTUS) 100 UNIT/ML injection      irbesartan (AVAPRO) 75 MG tablet Take 75 mg by mouth daily.     JARDIANCE 10 MG TABS tablet       simvastatin (ZOCOR) 20 MG tablet Take 20 mg by mouth daily.     Tiotropium Bromide Monohydrate (SPIRIVA RESPIMAT) 2.5 MCG/ACT AERS Inhale 2 puffs into the lungs daily. 4 g 5   No current facility-administered medications for this visit.    REVIEW OF SYSTEMS (negative unless checked):   Cardiac:  []  Chest pain or chest pressure? []  Shortness of breath upon activity? []  Shortness of breath when lying flat? []  Irregular heart rhythm?  Vascular:  []  Pain in calf, thigh, or hip brought on by walking? []  Pain in feet at night that wakes you up from your sleep? []  Blood clot in your veins? []  Leg swelling?  Pulmonary:  []  Oxygen at home? []  Productive cough? []  Wheezing?  Neurologic:  []  Sudden weakness in arms or legs? []  Sudden numbness in arms or legs? []  Sudden onset of difficult speaking or slurred speech? []  Temporary loss of vision in one eye? []  Problems with dizziness?  Gastrointestinal:  []  Blood in stool? []  Vomited blood?  Genitourinary:  []  Burning when urinating? []  Blood in urine?  Psychiatric:  []  Major depression  Hematologic:  []  Bleeding problems? []  Problems with blood clotting?  Dermatologic:  []  Rashes or ulcers?  Constitutional:  []   Fever or chills?  Ear/Nose/Throat:  []  Change in hearing? []  Nose bleeds? []  Sore throat?  Musculoskeletal:  []  Back pain? []  Joint pain? []  Muscle pain?   Physical Examination   Vitals:   12/17/22 1055  BP: 134/67  Pulse: 74  Resp: 20  Temp: (!) 97.5 F (36.4 C)  TempSrc: Temporal  SpO2: 100%  Weight: 154 lb 8 oz (70.1 kg)  Height: 5\' 8"  (1.727 m)   Body mass index is 23.49 kg/m.  General:  WDWN in NAD; vital signs documented above Gait: Not observed HENT: WNL, normocephalic Pulmonary: normal non-labored breathing  Cardiac: regular Abdomen: soft, NT, no masses Skin: without rashes Vascular Exam/Pulses: palpable femoral pulses bilaterally Extremities: bilateral BKAs well healed   Musculoskeletal: no muscle wasting or atrophy  Neurologic: A&O X 3;  No focal weakness or paresthesias are detected Psychiatric:  The pt has Normal affect.   Medical Decision Making   Austin Savage is a 67 y.o. male who presents for surveillance of PAD  We have previously seen the patient for a right toe ulceration ultimately resulting in a right BKA in [place] in 2022.  We have also seen the patient for a left heel ulceration also resulting in left BKA at [facility] last year. His bilateral BKA's are well-healed.  He now ambulates with bilateral prosthetics without issue. He denies any thigh or buttock claudication.  He has no rest pain in his lower extremities or wounds. He can continue his Eliquis, aspirin, and statin.  He can follow-up with our office as needed   Austin Dubonnet PA-C [facility] Office: [phone]  Clinic MD: Karin Lieu

## 2023-01-13 ENCOUNTER — Telehealth: Payer: Self-pay | Admitting: Orthopedic Surgery

## 2023-01-13 MED ORDER — GABAPENTIN 300 MG PO CAPS: ORAL_CAPSULE | ORAL | 2 refills | Status: DC

## 2023-01-13 NOTE — Telephone Encounter (Signed)
Patient called needing Rx refilled Gabapentin. The number to contact patient is [phone]

## 2023-01-18 ENCOUNTER — Telehealth: Payer: Self-pay | Admitting: Orthopedic Surgery

## 2023-01-18 NOTE — Telephone Encounter (Signed)
Patient ask about his Gabapentin refill. Please advise

## 2023-01-18 NOTE — Telephone Encounter (Signed)
Chart showing that Rx was called in on Friday at 10:00 am, patient states it not there.Marland Kitchen

## 2023-01-18 NOTE — Telephone Encounter (Signed)
I called and spoke with the pharmacy and they say they have it and it is ready for him to pick up. I called and advised patient and he will pick up

## 2023-03-10 ENCOUNTER — Telehealth: Payer: Self-pay | Admitting: Adult Health

## 2023-03-10 NOTE — Telephone Encounter (Signed)
Patient states needs refill for Elquis. Pharmacy is [facility] [place]. Patient phone number is [phone].

## 2023-03-11 ENCOUNTER — Other Ambulatory Visit: Payer: Self-pay

## 2023-03-11 MED ORDER — APIXABAN 2.5 MG PO TABS: 2.5000 mg | ORAL_TABLET | Freq: Two times a day (BID) | ORAL | 5 refills | Status: DC

## 2023-03-11 NOTE — Telephone Encounter (Signed)
Spoke with patient. Advised Eliquis prescription has been sent to pharmacy. NFN att

## 2023-03-11 NOTE — Telephone Encounter (Signed)
Spoke with patient. Advised Eliquis prescription has been sent to pharmacy. NFN

## 2023-04-21 ENCOUNTER — Telehealth: Payer: Self-pay

## 2023-04-21 NOTE — Telephone Encounter (Signed)
Tammy please advise if your able to clear patient for surgery based on last OV. He has been advised he will need an OV due to him not being seen but he is requesting clearance be completed anyway due to the pain he is in

## 2023-04-21 NOTE — Telephone Encounter (Signed)
If he is not getting done till end of month-September  then Dr. Delton Coombes  will take care at [facility] on 05/03/23. If he needs clearance for eliquis for routine teeth extraction with local numbing than will hold Eliquis for 24hr prior to procedure. If multiple teeth extraction than hold for 48hr. This would be better discussed at Office visit and with all the information from requesting office .

## 2023-04-21 NOTE — Telephone Encounter (Signed)
[facility] is the office. They didn't say the surgeon and patient is getting teeth extractions. Procedure date is unknown- I'm assuming over the next month since he is in so much pain

## 2023-04-21 NOTE — Telephone Encounter (Signed)
ATC X1 LVM for patient to call the office back. Received surgical clearance request from [facility]. Pt will need current OV since he hasn't been seen since January. Please schedule with TP or Dr. Delton Coombes

## 2023-04-21 NOTE — Telephone Encounter (Signed)
What surgery is he getting and when is it scheduled ? Who is doing the surgery

## 2023-04-21 NOTE — Telephone Encounter (Signed)
Patient returned call. Offered to schedule 9/10 with Dr Delton Coombes, and patient said that was too far out. I explained that we have not seen him since January and need to see him for clearance, and he stated he was in too much pain to wait that long for clearance. He is requesting for forms to be filled out so that he can get his surgery ASAP and that he will attend visit on 9/10 as a followup. I did explain that we likely would be unable to do that, but that we would go ahead and secure the 9/10 appointment so that it did not get booked by someone else. Paige, please advise and call patient back to help him better understand clearance or see if we can get him seen sooner (no apps had anything prior to appointment booked)

## 2023-04-25 NOTE — Telephone Encounter (Signed)
We will assess him 9/10 as planned.

## 2023-05-03 ENCOUNTER — Encounter: Payer: Self-pay | Admitting: Emergency Medicine

## 2023-05-03 ENCOUNTER — Ambulatory Visit: Payer: Medicare HMO | Admitting: Emergency Medicine

## 2023-05-03 VITALS — BP 130/60 | HR 74 | Ht 68.0 in | Wt 161.6 lb

## 2023-05-03 DIAGNOSIS — J4489 Other specified chronic obstructive pulmonary disease: Secondary | ICD-10-CM

## 2023-05-03 DIAGNOSIS — I2699 Other pulmonary embolism without acute cor pulmonale: Secondary | ICD-10-CM | POA: Diagnosis not present

## 2023-05-03 NOTE — Patient Instructions (Signed)
You are cleared from a pulmonary standpoint to proceed with your dental extraction as planned with [facility].  No contraindication to proceeding. You need to STOP your Eliquis 2 days prior to dental extraction. You can restart the Eliquis at the same dose, 2.5 mg twice a day, when the dentist tells you that it is okay postoperatively. We will send a copy of your office notes today to [facility] to clarify that you can proceed and our recommendations. Continue your other medicines as you have been taking them Follow Dr. Delton Coombes in 1 year, sooner if you have problems.

## 2023-05-03 NOTE — Progress Notes (Signed)
   Subjective:    Patient ID: Austin Savage, male    DOB: 03/23/1956, 67 y.o.   MRN: [id number]  HPI  ROV 04/02/21 --Austin Savage is a 74, history of minimal tobacco use, diabetes, hypertension and allergic rhinitis.  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  We continued him on Eliquis 2.5 mg twice daily for prevention.  He has mixed obstruction and restriction on PFT.  On Spiriva for COPD, believes that it is helpful. He can still get some exertional SOB w house work or yard work. Has albuterol available, uses a few times a week. No flares. No evidence bleeding.  Also dealing with allergic rhinitis, taking flonase mainly at night - helps but still has congestion during the day.   ROV 05/03/2023 --Austin Savage is 65 and has a history of minimal tobacco use with some mild asthma/COPD, diabetes, rhinitis, unprovoked bilateral pulmonary emboli in 2020.  We continued him on long-term Eliquis 2.5 mg twice daily.  He is on Spiriva.  He is planning for surgery - needs dental extractions. Having a lot of dental pain, difficulty eating.    Review of Systems As above     Objective:   Physical Exam Vitals:   05/03/23 1442  BP: 130/60  Pulse: 74  SpO2: 98%  Weight: 161 lb 9.6 oz (73.3 kg)  Height: 5\' 8"  (1.727 m)    Gen: Pleasant, obese man, in no distress,  normal affect  ENT: No lesions,  mouth clear,  oropharynx clear, no postnasal drip  Neck: No JVD, no stridor  Lungs: No use of accessory muscles, no crackles or wheezing on normal respiration, no wheeze on forced expiration  Cardiovascular: RRR, heart sounds normal, no murmur or gallops, no peripheral edema  Musculoskeletal: No deformities, no cyanosis or clubbing  Neuro: alert, awake, non focal  Skin: Warm, no lesions or rash      Assessment & Plan:  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  No evidence of flaring.  No barriers or contraindications to proceeding with his surgical  procedure  Pulmonary embolism (HCC) He is on prophylactic prevention dose Eliquis.  Plan will be for him to be on this lifelong as long as he can tolerate.  There is no contraindication to holding this medication so he can get his dental extraction.  He needs to hold the Eliquis for 2 days prior to his dental procedure.  Explained this to him in detail.  He can then get back on the Eliquis 2.5 mg twice daily when he is cleared to do so by his dentist depending on the procedure, bleeding risk, etc.    You are cleared from a pulmonary standpoint to proceed with your dental extraction as planned with [facility].  No contraindication to proceeding. You need to STOP your Eliquis 2 days prior to dental extraction. You can restart the Eliquis at the same dose, 2.5 mg twice a day, when the dentist tells you that it is okay postoperatively. We will send a copy of your office notes today to [facility] to clarify that you can proceed and our recommendations. Continue your other medicines as you have been taking them Follow Dr. Delton Coombes in 1 year, sooner if you have problems.   Levy Pupa, MD, PhD 05/03/2023, 2:59 PM [facility] [phone] or if no answer [phone]

## 2023-05-03 NOTE — Assessment & Plan Note (Signed)
Mild COPD asthma, minimal albuterol use.  No evidence of flaring.  No barriers or contraindications to proceeding with his surgical procedure

## 2023-05-03 NOTE — Telephone Encounter (Signed)
Clearance and OV notes faxed back to [employer]. Closing encounter nfn

## 2023-05-03 NOTE — Assessment & Plan Note (Signed)
He is on prophylactic prevention dose Eliquis.  Plan will be for him to be on this lifelong as long as he can tolerate.  There is no contraindication to holding this medication so he can get his dental extraction.  He needs to hold the Eliquis for 2 days prior to his dental procedure.  Explained this to him in detail.  He can then get back on the Eliquis 2.5 mg twice daily when he is cleared to do so by his dentist depending on the procedure, bleeding risk, etc.    You are cleared from a pulmonary standpoint to proceed with your dental extraction as planned with [facility].  No contraindication to proceeding. You need to STOP your Eliquis 2 days prior to dental extraction. You can restart the Eliquis at the same dose, 2.5 mg twice a day, when the dentist tells you that it is okay postoperatively. We will send a copy of your office notes today to [facility] to clarify that you can proceed and our recommendations. Continue your other medicines as you have been taking them Follow Dr. Delton Coombes in 1 year, sooner if you have problems.

## 2023-06-09 ENCOUNTER — Telehealth: Payer: Self-pay | Admitting: Orthopedic Surgery

## 2023-06-09 MED ORDER — GABAPENTIN 300 MG PO CAPS: ORAL_CAPSULE | ORAL | 2 refills | Status: DC

## 2023-06-09 NOTE — Telephone Encounter (Signed)
Pt called in requesting Gabapentin refill sent to [facility] in [place]

## 2023-06-09 NOTE — Addendum Note (Signed)
Addended by: Willia Craze on: 06/09/2023 10:43 AM   Modules accepted: Orders

## 2023-08-01 ENCOUNTER — Telehealth: Payer: Self-pay | Admitting: Emergency Medicine

## 2023-08-01 MED ORDER — FLUTICASONE PROPIONATE 50 MCG/ACT NA SUSP: 1.0000 | Freq: Every day | NASAL | 2 refills | Status: AC

## 2023-08-01 NOTE — Telephone Encounter (Signed)
Patient states needs refill for Flonase spray. Pharmacy is [facility] [place]. Patient phone number is [phone].

## 2023-08-18 ENCOUNTER — Other Ambulatory Visit: Payer: Self-pay | Admitting: Adult Health

## 2023-08-18 ENCOUNTER — Telehealth: Payer: Self-pay | Admitting: Emergency Medicine

## 2023-08-18 NOTE — Telephone Encounter (Signed)
I called the pt and left a voicemail. I am not sure if the pt was cleared by the dentist to start this rx again postoperatively. I asked the pt per voicemail to get [place] a call back or send a mychart message to clarify if he can start back up again.

## 2023-08-18 NOTE — Telephone Encounter (Signed)
Patient is returning a missed call. He thinks it is about his eliquis.   Phamarmay: [facility] on [address] [place]

## 2023-08-19 MED ORDER — APIXABAN 2.5 MG PO TABS: 2.5000 mg | ORAL_TABLET | Freq: Two times a day (BID) | ORAL | 5 refills | Status: DC

## 2023-08-19 NOTE — Telephone Encounter (Signed)
Spoke with the pt  He is asking for a refill on eliquis  Rx was sent to pharm  Nothing further needed

## 2023-10-13 ENCOUNTER — Ambulatory Visit: Payer: Medicare HMO | Admitting: Orthopedic Surgery

## 2023-10-14 ENCOUNTER — Ambulatory Visit: Payer: Medicare HMO | Admitting: Orthopedic Surgery

## 2023-10-17 ENCOUNTER — Telehealth: Payer: Self-pay | Admitting: Orthopedic Surgery

## 2023-10-17 MED ORDER — GABAPENTIN 300 MG PO CAPS: ORAL_CAPSULE | ORAL | 2 refills | Status: DC

## 2023-10-17 NOTE — Telephone Encounter (Signed)
 Patient called needing Rx refilled Gabapentin. The number to contact patient is [phone]

## 2023-10-27 ENCOUNTER — Ambulatory Visit: Payer: Medicare HMO | Admitting: Orthopedic Surgery

## 2023-10-27 ENCOUNTER — Other Ambulatory Visit (INDEPENDENT_AMBULATORY_CARE_PROVIDER_SITE_OTHER): Payer: Self-pay

## 2023-10-27 DIAGNOSIS — M4807 Spinal stenosis, lumbosacral region: Secondary | ICD-10-CM

## 2023-10-27 DIAGNOSIS — M51369 Other intervertebral disc degeneration, lumbar region without mention of lumbar back pain or lower extremity pain: Secondary | ICD-10-CM

## 2023-10-27 NOTE — Progress Notes (Signed)
 Orthopedic Spine Surgery Office Note   Assessment: Patient is a 68 y.o. male with periodic low back pain.  Has been doing well since he was last seen.  States his back pain is well-controlled with the gabapentin     Plan: -No operative intervention recommended at this time -Patient recently got a refill of gabapentin, so no refills provided today but will refill in the future if needed -Would need an A1c of 7.5 or less prior to any elective surgery -Patient should return to office in 1 year, x-rays at next visit: lumbar AP/lateral/flex/ex     Patient expressed understanding of the plan and all questions were answered to the patient's satisfaction.    ___________________________________________________________________________     History:   Patient is a 68 y.o. male who presents today for follow-up on his lumbar spine.  Patient has a long history of low back pain.  It usually comes and goes.  He states right now his back pain is tolerable.  He has been using gabapentin to control it.  He recently got a refill.  He is not having any pain radiating to his lower extremities.  No bowel or bladder incontinence.  No saddle anesthesia.  Has not developed any new symptoms since he was last seen.     Physical Exam:   General: no acute distress, appears stated age Neurologic: alert, answering questions appropriately, following commands Respiratory: unlabored breathing on room air, symmetric chest rise Psychiatric: appropriate affect, normal cadence to speech     MSK (spine):   -Strength exam                                                   Left                  Right EHL                              -/5                   -/5 TA                                 -/5                   -/5 GSC                             -/5                   -/5 Knee extension            5/5                  5/5 Hip flexion                    5/5                  5/5   Unable to test distal lower extremity  function bilaterally due to amputation   -Sensory exam  Sensation intact to light touch proximal to the incision sites bilaterally in the lower extremities    Imaging: XRs of the lumbar spine from 10/27/2023 were independently reviewed and interpreted, showing grade 1 spondylolisthesis at L4/5.  Minimal disc height loss seen at L4/5 and L5/S1.  No other significant degenerative changes seen. No evidence of instability on flexion/tension views.  No fracture or dislocation seen.     Patient name: Austin Savage Patient MRN: [id number] Date of visit: 10/27/23

## 2024-01-17 ENCOUNTER — Other Ambulatory Visit: Payer: Self-pay | Admitting: Emergency Medicine

## 2024-02-27 ENCOUNTER — Telehealth: Payer: Self-pay | Admitting: Orthopedic Surgery

## 2024-02-27 NOTE — Telephone Encounter (Signed)
 Pt called requesting refill of gabapentin . Please send to [facility]. Pt phone number is [phone].

## 2024-02-29 MED ORDER — GABAPENTIN 300 MG PO CAPS: ORAL_CAPSULE | ORAL | 2 refills | Status: DC

## 2024-05-03 ENCOUNTER — Ambulatory Visit: Admitting: Emergency Medicine

## 2024-05-03 ENCOUNTER — Encounter: Payer: Self-pay | Admitting: Emergency Medicine

## 2024-05-03 VITALS — BP 148/72 | HR 87 | Ht 68.0 in | Wt 175.0 lb

## 2024-05-03 DIAGNOSIS — J4489 Other specified chronic obstructive pulmonary disease: Secondary | ICD-10-CM | POA: Diagnosis not present

## 2024-05-03 DIAGNOSIS — I2699 Other pulmonary embolism without acute cor pulmonale: Secondary | ICD-10-CM | POA: Diagnosis not present

## 2024-05-03 NOTE — Assessment & Plan Note (Signed)
 On prophylactic Eliquis .  Plan is to remain on this lifelong as long as he is tolerating.  Please continue your Eliquis  2.5 mg twice a day. You will need to stop the Eliquis  2-3 days prior to any planned dental surgery.  Then you can restart it when the dentist clears you to get back on it.

## 2024-05-03 NOTE — Progress Notes (Signed)
   Subjective:    Patient ID: Austin Savage, male    DOB: July 16, 1956, 68 y.o.   MRN: [id number]  HPI  ROV 04/02/21 --Austin Savage is a 5, history of minimal tobacco use, diabetes, hypertension and allergic rhinitis.  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  We continued him on Eliquis  2.5 mg twice daily for prevention.  He has mixed obstruction and restriction on PFT.  On Spiriva  for COPD, believes that it is helpful. He can still get some exertional SOB w house work or yard work. Has albuterol  available, uses a few times a week. No flares. No evidence bleeding.  Also dealing with allergic rhinitis, taking flonase  mainly at night - helps but still has congestion during the day.   ROV 05/03/2023 --Austin Savage is 73 and has a history of minimal tobacco use with some mild asthma/COPD, diabetes, rhinitis, unprovoked bilateral pulmonary emboli in 2020.  We continued him on long-term Eliquis  2.5 mg twice daily.  He is on Spiriva .  He is planning for surgery - needs dental extractions. Having a lot of dental pain, difficulty eating.   ROV 05/03/2024 --follow-up visit for 68 year old gentleman whom I have followed for mild asthma/COPD and an unprovoked bilateral PE in 2020.  He is currently maintained on long-term Eliquis  2.5 mg twice daily and Spiriva .  PMH also significant for diabetes, rhinitis.  Last seen 1 year ago. He is able to exert, mows his grass, walks without SOB. No cough. Has no bleeding. No CP. Rare albuterol  use, once a month. No flares. Needs refills.     Review of Systems As above     Objective:   Physical Exam Vitals:   05/03/24 1121  BP: (!) 148/72  Pulse: 87  SpO2: 99%  Weight: 175 lb (79.4 kg)  Height: 5' 8 (1.727 m)    Gen: Pleasant, obese man, in no distress,  normal affect  ENT: No lesions,  mouth clear,  oropharynx clear, no postnasal drip  Neck: No JVD, no stridor  Lungs: No use of accessory muscles, distant, no crackles or wheezing on normal  respiration, no wheeze on forced expiration  Cardiovascular: RRR, heart sounds normal, no murmur or gallops, no peripheral edema  Musculoskeletal: Bilateral lower extremity amputations with prostheses in place  Neuro: alert, awake, non focal  Skin: Warm, no lesions or rash      Assessment & Plan:  COPD with asthma (HCC) Continue your Spiriva  once daily. Keep your albuterol  available to use 2 puffs when needed for shortness of breath, chest tightness, wheezing. Keep up the good work with your exercise routine.  Get the flu shot this fall Follow Dr. Shelah in 1 year, call sooner if you have any problems.  Pulmonary embolism (HCC) On prophylactic Eliquis .  Plan is to remain on this lifelong as long as he is tolerating.  Please continue your Eliquis  2.5 mg twice a day. You will need to stop the Eliquis  2-3 days prior to any planned dental surgery.  Then you can restart it when the dentist clears you to get back on it.    Lamar Shelah, MD, PhD 05/03/2024, 11:47 AM [facility] [phone] or if no answer [phone]

## 2024-05-03 NOTE — Patient Instructions (Signed)
 Please continue your Eliquis  2.5 mg twice a day. You will need to stop the Eliquis  2-3 days prior to any planned dental surgery.  Then you can restart it when the dentist clears you to get back on it. Continue your Spiriva  once daily. Keep your albuterol  available to use 2 puffs when needed for shortness of breath, chest tightness, wheezing. Keep up the good work with your exercise routine.  Get the flu shot this fall Follow Dr. Shelah in 1 year, call sooner if you have any problems.

## 2024-05-03 NOTE — Assessment & Plan Note (Signed)
 Continue your Spiriva  once daily. Keep your albuterol  available to use 2 puffs when needed for shortness of breath, chest tightness, wheezing. Keep up the good work with your exercise routine.  Get the flu shot this fall Follow Dr. Shelah in 1 year, call sooner if you have any problems.

## 2024-06-25 ENCOUNTER — Encounter: Payer: Self-pay | Admitting: Radiology

## 2024-07-03 ENCOUNTER — Telehealth: Payer: Self-pay | Admitting: Orthopedic Surgery

## 2024-07-03 MED ORDER — GABAPENTIN 300 MG PO CAPS: ORAL_CAPSULE | ORAL | 2 refills | Status: AC

## 2024-07-03 NOTE — Telephone Encounter (Signed)
 Pt called requesting refill Gabapentin . Please send to pharmacy on file. Pt phone number is [phone]

## 2024-08-09 ENCOUNTER — Other Ambulatory Visit: Payer: Self-pay | Admitting: Emergency Medicine

## 2024-08-09 MED ORDER — APIXABAN 2.5 MG PO TABS: 2.5000 mg | ORAL_TABLET | Freq: Two times a day (BID) | ORAL | 4 refills | Status: DC

## 2024-08-09 NOTE — Telephone Encounter (Signed)
 Copied from CRM #[id number]. Topic: Clinical - Medication Refill >> Aug 09, 2024  9:13 AM [name] wrote: Medication: apixaban  (ELIQUIS ) 2.5 MG TABS tablet [[id number]]  Has the patient contacted their pharmacy? No (Agent: If no, request that the patient contact the pharmacy for the refill. If patient does not wish to contact the pharmacy document the reason why and proceed with request.) (Agent: If yes, when and what did the pharmacy advise?)  This is the patient's preferred pharmacy:  [facility] [address], [place] - [address] [place] [address] Phone: [phone] Fax: [phone]  Is this the correct pharmacy for this prescription? Yes If no, delete pharmacy and type the correct one.   Has the prescription been filled recently? Yes  Is the patient out of the medication? Yes  Has the patient been seen for an appointment in the last year OR does the patient have an upcoming appointment? Yes  Can we respond through MyChart? No  Agent: Please be advised that Rx refills may take up to 3 business days. We ask that you follow-up with your pharmacy.

## 2024-09-10 ENCOUNTER — Telehealth: Payer: Self-pay

## 2024-09-10 ENCOUNTER — Other Ambulatory Visit (HOSPITAL_COMMUNITY): Payer: Self-pay

## 2024-09-10 NOTE — Telephone Encounter (Signed)
 Copied from CRM [id number]. Topic: Clinical - Prescription Issue >> Sep 10, 2024 10:16 AM [name] wrote: Reason for CRM: Patient seeking advisement regarding ELIQUIS  States has gone up to $249 and would like to know if there is an alternative or if he can stop taking it.   Having pharmacy team look into it maybe a deductible needs to be met

## 2024-09-11 ENCOUNTER — Telehealth: Payer: Self-pay

## 2024-09-11 NOTE — Telephone Encounter (Signed)
 Spoke with patient VBU, will call insurance and see if he can work something out with them

## 2024-09-11 NOTE — Telephone Encounter (Signed)
 Copied from CRM [id number]. Topic: Clinical - Prescription Issue >> Sep 10, 2024 10:16 AM [name] wrote: Reason for CRM: Patient seeking advisement regarding ELIQUIS  States has gone up to $249 and would like to know if there is an alternative or if he can stop taking it. >> Sep 10, 2024  4:36 PM Leila BROCKS wrote: Patient [phone] states called earlier today, ran out of (ELIQUIS ) 2.5 MG TABS tablet, and [facility] pharmacy states the $249 for 60 days supply. Patient cannot afford that, and is asking for another medication. Patient states has not heard from the office. Patient toke the last pill yesterday, did not any medication for today. Per CAL, CMA is unavailable send a message, please call back .  [facility] [address] [place] -  [address] [place] [address] Phone: [phone] Fax: [phone]    Already been addressed patient has a deductible to meet

## 2024-09-13 ENCOUNTER — Telehealth: Payer: Self-pay

## 2024-09-13 DIAGNOSIS — I2699 Other pulmonary embolism without acute cor pulmonale: Secondary | ICD-10-CM

## 2024-09-13 MED ORDER — APIXABAN 2.5 MG PO TABS: 2.5000 mg | ORAL_TABLET | Freq: Two times a day (BID) | ORAL | 5 refills | Status: AC

## 2024-09-13 NOTE — Telephone Encounter (Unsigned)
 Copied from CRM [id number]. Topic: Clinical - Prescription Issue >> Sep 11, 2024 10:13 AM Russell PARAS wrote: Reason for CRM:   Angeline, with [facility], is calling on behalf of pt regarding his prescribed Eliquis . He was charged double by pharmacy due to refilling too soon. He has no remaining refills  Pt is requesting if samples of med would be avail, or if a short term prescription could be sent to the [facility] in [place].   Requested call back  CB#  [phone]

## 2024-09-13 NOTE — Telephone Encounter (Signed)
 Refill for eliquis  sent to pharmacy.  JD

## 2024-10-25 ENCOUNTER — Ambulatory Visit: Admitting: Orthopedic Surgery
